# Patient Record
Sex: Male | Born: 1980
Health system: Southern US, Community
[De-identification: ages and names within clinical notes are randomized; demographics above are authoritative.]

## PROBLEM LIST (undated history)

## (undated) DIAGNOSIS — S0990XA Unspecified injury of head, initial encounter: Secondary | ICD-10-CM

## (undated) DIAGNOSIS — F528 Other sexual dysfunction not due to a substance or known physiological condition: Secondary | ICD-10-CM

## (undated) DIAGNOSIS — E785 Hyperlipidemia, unspecified: Secondary | ICD-10-CM

## (undated) DIAGNOSIS — J302 Other seasonal allergic rhinitis: Secondary | ICD-10-CM

## (undated) DIAGNOSIS — C801 Malignant (primary) neoplasm, unspecified: Secondary | ICD-10-CM

## (undated) DIAGNOSIS — L709 Acne, unspecified: Secondary | ICD-10-CM

## (undated) DIAGNOSIS — Z87442 Personal history of urinary calculi: Secondary | ICD-10-CM

## (undated) DIAGNOSIS — J342 Deviated nasal septum: Secondary | ICD-10-CM

## (undated) HISTORY — DX: Hyperlipidemia, unspecified: E78.5

## (undated) HISTORY — DX: Other sexual dysfunction not due to a substance or known physiological condition: F52.8

## (undated) HISTORY — DX: Malignant (primary) neoplasm, unspecified: C80.1

## (undated) HISTORY — PX: TRACHEOSTOMY CLOSURE: SHX458

## (undated) HISTORY — DX: Deviated nasal septum: J34.2

---
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
## 2000-05-05 ENCOUNTER — Ambulatory Visit (HOSPITAL_COMMUNITY): Admission: RE | Admit: 2000-05-05 | Discharge: 2000-05-05 | Payer: Self-pay | Admitting: Internal Medicine

## 2000-05-05 ENCOUNTER — Encounter: Payer: Self-pay | Admitting: Internal Medicine

## 2001-12-17 ENCOUNTER — Emergency Department (HOSPITAL_COMMUNITY): Admission: EM | Admit: 2001-12-17 | Discharge: 2001-12-17 | Payer: Self-pay | Admitting: Emergency Medicine

## 2005-05-06 ENCOUNTER — Ambulatory Visit: Payer: Self-pay | Admitting: Internal Medicine

## 2007-03-08 ENCOUNTER — Ambulatory Visit: Payer: Self-pay | Admitting: Internal Medicine

## 2008-04-16 ENCOUNTER — Telehealth (INDEPENDENT_AMBULATORY_CARE_PROVIDER_SITE_OTHER): Payer: Self-pay | Admitting: *Deleted

## 2008-06-03 ENCOUNTER — Ambulatory Visit: Payer: Self-pay | Admitting: Internal Medicine

## 2008-06-03 LAB — CONVERTED CEMR LAB
ALT: 19 U/L
AST: 25 U/L
Albumin: 4.3 g/dL
Alkaline Phosphatase: 77 U/L
BUN: 13 mg/dL
Basophils Absolute: 0 10*3/uL
Basophils Relative: 0.3 %
Bilirubin Urine: NEGATIVE
Bilirubin, Direct: 0.3 mg/dL
CO2: 30 meq/L
Calcium: 9.2 mg/dL
Chloride: 109 meq/L
Cholesterol: 168 mg/dL
Creatinine, Ser: 1.2 mg/dL
Eosinophils Absolute: 0.2 10*3/uL
Eosinophils Relative: 2.8 %
GFR calc Af Amer: 92 mL/min
GFR calc non Af Amer: 76 mL/min
Glucose, Bld: 94 mg/dL
HCT: 49 %
HDL: 37.1 mg/dL — ABNORMAL LOW
Hemoglobin: 17.5 g/dL — ABNORMAL HIGH
Ketones, ur: NEGATIVE mg/dL
LDL Cholesterol: 105 mg/dL — ABNORMAL HIGH
Leukocytes, UA: NEGATIVE
Lymphocytes Relative: 33.7 %
MCHC: 35.7 g/dL
MCV: 96.7 fL
Monocytes Absolute: 0.6 10*3/uL
Monocytes Relative: 7.3 %
Neutro Abs: 4.3 10*3/uL
Neutrophils Relative %: 55.9 %
Nitrite: NEGATIVE
Platelets: 208 10*3/uL
Potassium: 4.3 meq/L
RBC: 5.07 M/uL
RDW: 11.1 % — ABNORMAL LOW
Sodium: 143 meq/L
Specific Gravity, Urine: 1.02
TSH: 0.97 u[IU]/mL
Total Bilirubin: 1.4 mg/dL — ABNORMAL HIGH
Total CHOL/HDL Ratio: 4.5
Total Protein, Urine: NEGATIVE mg/dL
Total Protein: 6.9 g/dL
Triglycerides: 130 mg/dL
Urine Glucose: NEGATIVE mg/dL
Urobilinogen, UA: 0.2
VLDL: 26 mg/dL
WBC: 7.7 10*3/uL
pH: 6.5

## 2008-06-04 LAB — CONVERTED CEMR LAB

## 2008-06-06 ENCOUNTER — Ambulatory Visit: Payer: Self-pay | Admitting: Internal Medicine

## 2008-06-06 DIAGNOSIS — F528 Other sexual dysfunction not due to a substance or known physiological condition: Secondary | ICD-10-CM

## 2008-06-06 DIAGNOSIS — J342 Deviated nasal septum: Secondary | ICD-10-CM | POA: Insufficient documentation

## 2008-06-06 DIAGNOSIS — J309 Allergic rhinitis, unspecified: Secondary | ICD-10-CM | POA: Insufficient documentation

## 2008-06-06 HISTORY — DX: Other sexual dysfunction not due to a substance or known physiological condition: F52.8

## 2011-02-23 ENCOUNTER — Other Ambulatory Visit: Payer: Self-pay | Admitting: Internal Medicine

## 2011-02-23 ENCOUNTER — Other Ambulatory Visit: Payer: Self-pay

## 2011-02-23 DIAGNOSIS — Z0000 Encounter for general adult medical examination without abnormal findings: Secondary | ICD-10-CM

## 2011-02-24 ENCOUNTER — Other Ambulatory Visit (INDEPENDENT_AMBULATORY_CARE_PROVIDER_SITE_OTHER): Payer: Commercial Managed Care - PPO

## 2011-02-24 DIAGNOSIS — Z0000 Encounter for general adult medical examination without abnormal findings: Secondary | ICD-10-CM

## 2011-02-24 LAB — CBC WITH DIFFERENTIAL/PLATELET
Basophils Absolute: 0 10*3/uL (ref 0.0–0.1)
Basophils Relative: 0.3 % (ref 0.0–3.0)
Eosinophils Absolute: 0.1 10*3/uL (ref 0.0–0.7)
Eosinophils Relative: 2 % (ref 0.0–5.0)
HCT: 47.2 % (ref 39.0–52.0)
Hemoglobin: 16.7 g/dL (ref 13.0–17.0)
Lymphocytes Relative: 30.9 % (ref 12.0–46.0)
Lymphs Abs: 2.3 10*3/uL (ref 0.7–4.0)
MCHC: 35.3 g/dL (ref 30.0–36.0)
MCV: 96.8 fl (ref 78.0–100.0)
Monocytes Absolute: 0.7 10*3/uL (ref 0.1–1.0)
Monocytes Relative: 8.9 % (ref 3.0–12.0)
Neutro Abs: 4.3 10*3/uL (ref 1.4–7.7)
Neutrophils Relative %: 57.9 % (ref 43.0–77.0)
Platelets: 177 10*3/uL (ref 150.0–400.0)
RBC: 4.88 Mil/uL (ref 4.22–5.81)
RDW: 12.2 % (ref 11.5–14.6)
WBC: 7.4 10*3/uL (ref 4.5–10.5)

## 2011-02-24 LAB — HEPATIC FUNCTION PANEL
Albumin: 4.2 g/dL (ref 3.5–5.2)
Total Protein: 6.8 g/dL (ref 6.0–8.3)

## 2011-02-24 LAB — URIC ACID: Uric Acid, Serum: 6.5 mg/dL (ref 4.0–7.8)

## 2011-02-24 LAB — BASIC METABOLIC PANEL
BUN: 13 mg/dL (ref 6–23)
CO2: 30 mEq/L (ref 19–32)
Calcium: 9.5 mg/dL (ref 8.4–10.5)
Creatinine, Ser: 1.2 mg/dL (ref 0.4–1.5)
Glucose, Bld: 87 mg/dL (ref 70–99)

## 2011-02-24 LAB — TSH: TSH: 1.87 u[IU]/mL (ref 0.35–5.50)

## 2011-02-24 LAB — LIPID PANEL: Cholesterol: 146 mg/dL (ref 0–200)

## 2011-02-28 ENCOUNTER — Encounter: Payer: Self-pay | Admitting: Internal Medicine

## 2011-02-28 DIAGNOSIS — Z0001 Encounter for general adult medical examination with abnormal findings: Secondary | ICD-10-CM | POA: Insufficient documentation

## 2011-03-02 ENCOUNTER — Ambulatory Visit (INDEPENDENT_AMBULATORY_CARE_PROVIDER_SITE_OTHER): Payer: 59 | Admitting: Internal Medicine

## 2011-03-02 ENCOUNTER — Encounter: Payer: Self-pay | Admitting: Internal Medicine

## 2011-03-02 VITALS — BP 130/68 | HR 74 | Temp 97.9°F | Ht 72.0 in | Wt 183.0 lb

## 2011-03-02 DIAGNOSIS — Z0000 Encounter for general adult medical examination without abnormal findings: Secondary | ICD-10-CM

## 2011-03-02 MED ORDER — SILDENAFIL CITRATE 100 MG PO TABS: 100.0000 mg | ORAL_TABLET | Freq: Every day | ORAL | Status: DC | PRN

## 2011-03-02 MED ORDER — CETIRIZINE HCL 10 MG PO TABS: 10.0000 mg | ORAL_TABLET | Freq: Every day | ORAL | Status: DC

## 2011-03-02 MED ORDER — FLUTICASONE PROPIONATE 50 MCG/ACT NA SUSP: 2.0000 | Freq: Every day | NASAL | Status: DC

## 2011-03-02 NOTE — Progress Notes (Signed)
  Subjective:    Patient ID: Douglas Gibbs, male    DOB: 02-19-1981, 30 y.o.   MRN: [id number]  HPI Here for wellness and f/u;  Overall doing ok;  Pt denies CP, worsening SOB, DOE, wheezing, orthopnea, PND, worsening LE edema, palpitations, dizziness or syncope.  Pt denies neurological change such as new Headache, facial or extremity weakness.  Pt denies polydipsia, polyuria, or low sugar symptoms. Pt states overall good compliance with treatment and medications, good tolerability, and trying to follow lower cholesterol diet.  Pt denies worsening depressive symptoms, suicidal ideation or panic. No fever, wt loss, night sweats, loss of appetite, or other constitutional symptoms.  Pt states good ability with ADL's, low fall risk, home safety reviewed and adequate, no significant changes in hearing or vision, and occasionally active with exercise.   Past Medical History  Diagnosis Date  . ERECTILE DYSFUNCTION 06/06/2008  . Deviated nasal septum 06/06/2008  . ALLERGIC RHINITIS 06/06/2008   Past Surgical History  Procedure Date  . S/p tracheostomy 2000    reports that he has quit smoking. He does not have any smokeless tobacco history on file. He reports that he drinks alcohol. His drug history not on file. family history includes Cancer in his other. No Known Allergies No current outpatient prescriptions on file prior to visit.       Review of Systems Review of Systems  Constitutional: Negative for diaphoresis, activity change, appetite change and unexpected weight change.  HENT: Negative for hearing loss, ear pain, facial swelling, mouth sores and neck stiffness.   Eyes: Negative for pain, redness and visual disturbance.  Respiratory: Negative for shortness of breath and wheezing.   Cardiovascular: Negative for chest pain and palpitations.  Gastrointestinal: Negative for diarrhea, blood in stool, abdominal distention and rectal pain.  Genitourinary: Negative for hematuria, flank pain and  decreased urine volume.  Musculoskeletal: Negative for myalgias and joint swelling.  Skin: Negative for color change and wound.  Neurological: Negative for syncope and numbness.  Hematological: Negative for adenopathy.  Psychiatric/Behavioral: Negative for hallucinations, self-injury, decreased concentration and agitation.      Objective:   Physical Exam BP 130/68  Pulse 74  Temp(Src) 97.9 F (36.6 C) (Oral)  Ht 6' (1.829 m)  Wt 183 lb (83.008 kg)  BMI 24.82 kg/m2  SpO2 97% Physical Exam  VS noted Constitutional: Pt is oriented to person, place, and time. Appears well-developed and well-nourished.  HENT:  Head: Normocephalic and atraumatic.  Right Ear: External ear normal.  Left Ear: External ear normal.  Nose: Nose normal.  Mouth/Throat: Oropharynx is clear and moist.  Eyes: Conjunctivae and EOM are normal. Pupils are equal, round, and reactive to light.  Neck: Normal range of motion. Neck supple. No JVD present. No tracheal deviation present.  Cardiovascular: Normal rate, regular rhythm, normal heart sounds and intact distal pulses.   Pulmonary/Chest: Effort normal and breath sounds normal.  Abdominal: Soft. Bowel sounds are normal. There is no tenderness.  Musculoskeletal: Normal range of motion. Exhibits no edema.  Lymphadenopathy:  Has no cervical adenopathy.  Neurological: Pt is alert and oriented to person, place, and time. Pt has normal reflexes. No cranial nerve deficit.  Skin: Skin is warm and dry. No rash noted.  Psychiatric:  Has  normal mood and affect. Behavior is normal.         Assessment & Plan:

## 2011-03-02 NOTE — Patient Instructions (Signed)
Continue all other medications as before Please follow lower cholesterol diet, and get regular exercise Please return in 1 year for your yearly visit, or sooner if needed, with Lab testing done 3-5 days before

## 2011-03-07 ENCOUNTER — Encounter: Payer: Self-pay | Admitting: Internal Medicine

## 2011-03-07 NOTE — Assessment & Plan Note (Signed)

## 2012-01-24 DIAGNOSIS — J342 Deviated nasal septum: Secondary | ICD-10-CM

## 2012-01-24 HISTORY — DX: Deviated nasal septum: J34.2

## 2012-02-08 ENCOUNTER — Encounter (HOSPITAL_BASED_OUTPATIENT_CLINIC_OR_DEPARTMENT_OTHER): Payer: Self-pay | Admitting: *Deleted

## 2012-02-14 ENCOUNTER — Encounter (HOSPITAL_BASED_OUTPATIENT_CLINIC_OR_DEPARTMENT_OTHER): Payer: Self-pay | Admitting: Anesthesiology

## 2012-02-14 ENCOUNTER — Ambulatory Visit (HOSPITAL_BASED_OUTPATIENT_CLINIC_OR_DEPARTMENT_OTHER)
Admission: RE | Admit: 2012-02-14 | Discharge: 2012-02-14 | Disposition: A | Discharge status: Home / Self Care | Payer: 59 | Source: Ambulatory Visit | Attending: Otolaryngology | Admitting: Otolaryngology

## 2012-02-14 ENCOUNTER — Encounter (HOSPITAL_BASED_OUTPATIENT_CLINIC_OR_DEPARTMENT_OTHER): Payer: Self-pay

## 2012-02-14 ENCOUNTER — Ambulatory Visit (HOSPITAL_BASED_OUTPATIENT_CLINIC_OR_DEPARTMENT_OTHER): Payer: 59 | Admitting: Anesthesiology

## 2012-02-14 ENCOUNTER — Encounter (HOSPITAL_BASED_OUTPATIENT_CLINIC_OR_DEPARTMENT_OTHER)
Admission: RE | Disposition: A | Discharge status: Home / Self Care | Payer: Self-pay | Source: Ambulatory Visit | Attending: Otolaryngology

## 2012-02-14 DIAGNOSIS — J343 Hypertrophy of nasal turbinates: Secondary | ICD-10-CM | POA: Insufficient documentation

## 2012-02-14 DIAGNOSIS — J342 Deviated nasal septum: Secondary | ICD-10-CM | POA: Insufficient documentation

## 2012-02-14 DIAGNOSIS — Z87828 Personal history of other (healed) physical injury and trauma: Secondary | ICD-10-CM | POA: Insufficient documentation

## 2012-02-14 HISTORY — PX: NASAL SEPTOPLASTY W/ TURBINOPLASTY: SHX2070

## 2012-02-14 HISTORY — DX: Other seasonal allergic rhinitis: J30.2

## 2012-02-14 HISTORY — DX: Acne, unspecified: L70.9

## 2012-02-14 HISTORY — DX: Unspecified injury of head, initial encounter: S09.90XA

## 2012-02-14 LAB — POCT HEMOGLOBIN-HEMACUE: Hemoglobin: 16.3 g/dL (ref 13.0–17.0)

## 2012-02-14 SURGERY — SEPTOPLASTY, NOSE, WITH NASAL TURBINATE REDUCTION
Anesthesia: General | Site: Nose | Laterality: Bilateral | Wound class: Clean Contaminated

## 2012-02-14 MED ORDER — ACETAMINOPHEN 10 MG/ML IV SOLN
1000.0000 mg | Freq: Once | INTRAVENOUS | Status: AC
  Administered 2012-02-14: 1000 mg via INTRAVENOUS

## 2012-02-14 MED ORDER — HYDROCODONE-ACETAMINOPHEN 5-325 MG PO TABS: 1.0000 | ORAL_TABLET | Freq: Four times a day (QID) | ORAL | Status: AC | PRN

## 2012-02-14 MED ORDER — AMOXICILLIN-POT CLAVULANATE 500-125 MG PO TABS: 1.0000 | ORAL_TABLET | Freq: Two times a day (BID) | ORAL | Status: AC

## 2012-02-14 MED ORDER — LIDOCAINE HCL (CARDIAC) 20 MG/ML IV SOLN
INTRAVENOUS | Status: DC | PRN
  Administered 2012-02-14: 50 mg via INTRAVENOUS

## 2012-02-14 MED ORDER — HYDROMORPHONE HCL PF 1 MG/ML IJ SOLN: 0.2500 mg | INTRAMUSCULAR | Status: DC | PRN

## 2012-02-14 MED ORDER — OXYMETAZOLINE HCL 0.05 % NA SOLN
NASAL | Status: DC | PRN
  Administered 2012-02-14: 1 via NASAL

## 2012-02-14 MED ORDER — FENTANYL CITRATE 0.05 MG/ML IJ SOLN
INTRAMUSCULAR | Status: DC | PRN
  Administered 2012-02-14: 100 ug via INTRAVENOUS
  Administered 2012-02-14 (×2): 25 ug via INTRAVENOUS

## 2012-02-14 MED ORDER — LACTATED RINGERS IV SOLN
INTRAVENOUS | Status: DC
  Administered 2012-02-14 (×2): via INTRAVENOUS

## 2012-02-14 MED ORDER — DROPERIDOL 2.5 MG/ML IJ SOLN
INTRAMUSCULAR | Status: DC | PRN
  Administered 2012-02-14: 0.625 mg via INTRAVENOUS

## 2012-02-14 MED ORDER — MUPIROCIN 2 % EX OINT
TOPICAL_OINTMENT | CUTANEOUS | Status: DC | PRN
  Administered 2012-02-14: 1 via NASAL

## 2012-02-14 MED ORDER — ONDANSETRON HCL 4 MG/2ML IJ SOLN
INTRAMUSCULAR | Status: DC | PRN
  Administered 2012-02-14: 4 mg via INTRAVENOUS

## 2012-02-14 MED ORDER — SUCCINYLCHOLINE CHLORIDE 20 MG/ML IJ SOLN
INTRAMUSCULAR | Status: DC | PRN
  Administered 2012-02-14: 100 mg via INTRAVENOUS

## 2012-02-14 MED ORDER — PROPOFOL 10 MG/ML IV EMUL
INTRAVENOUS | Status: DC | PRN
  Administered 2012-02-14: 250 mg via INTRAVENOUS

## 2012-02-14 MED ORDER — LIDOCAINE-EPINEPHRINE 1 %-1:100000 IJ SOLN
INTRAMUSCULAR | Status: DC | PRN
  Administered 2012-02-14: 10 mL

## 2012-02-14 MED ORDER — DEXAMETHASONE SODIUM PHOSPHATE 4 MG/ML IJ SOLN
INTRAMUSCULAR | Status: DC | PRN
  Administered 2012-02-14: 10 mg via INTRAVENOUS

## 2012-02-14 MED ORDER — METOCLOPRAMIDE HCL 5 MG/ML IJ SOLN: 10.0000 mg | Freq: Once | INTRAMUSCULAR | Status: DC | PRN

## 2012-02-14 MED ORDER — MIDAZOLAM HCL 5 MG/5ML IJ SOLN
INTRAMUSCULAR | Status: DC | PRN
  Administered 2012-02-14: 2 mg via INTRAVENOUS

## 2012-02-14 SURGICAL SUPPLY — 30 items
ATTRACTOMAT 16X20 MAGNETIC DRP (DRAPES) IMPLANT
BLADE SURG 15 STRL LF DISP TIS (BLADE) ×1 IMPLANT
BLADE SURG 15 STRL SS (BLADE)
CANISTER SUCTION 1200CC (MISCELLANEOUS) ×2 IMPLANT
CLOTH BEACON ORANGE TIMEOUT ST (SAFETY) ×2 IMPLANT
COAGULATOR SUCT SWTCH 10FR 6 (ELECTROSURGICAL) IMPLANT
DECANTER SPIKE VIAL GLASS SM (MISCELLANEOUS) IMPLANT
DRSG NASOPORE 8CM (GAUZE/BANDAGES/DRESSINGS) IMPLANT
ELECT REM PT RETURN 9FT ADLT (ELECTROSURGICAL)
ELECTRODE REM PT RTRN 9FT ADLT (ELECTROSURGICAL) IMPLANT
GLOVE BIO SURGEON STRL SZ 6.5 (GLOVE) ×2 IMPLANT
GLOVE BIOGEL M 7.0 STRL (GLOVE) ×4 IMPLANT
GOWN PREVENTION PLUS XLARGE (GOWN DISPOSABLE) ×3 IMPLANT
NEEDLE 27GAX1X1/2 (NEEDLE) ×2 IMPLANT
NS IRRIG 1000ML POUR BTL (IV SOLUTION) ×1 IMPLANT
PACK BASIN DAY SURGERY FS (CUSTOM PROCEDURE TRAY) ×2 IMPLANT
PACK ENT DAY SURGERY (CUSTOM PROCEDURE TRAY) ×2 IMPLANT
SET EXT MALE ROTATING LL 32IN (MISCELLANEOUS) ×2 IMPLANT
SET IV EXT TUBING FEMALE 31 (MISCELLANEOUS) ×1 IMPLANT
SPLINT NASAL DOYLE BI-VL (GAUZE/BANDAGES/DRESSINGS) ×2 IMPLANT
SPONGE GAUZE 2X2 8PLY STRL LF (GAUZE/BANDAGES/DRESSINGS) ×2 IMPLANT
SPONGE NEURO XRAY DETECT 1X3 (DISPOSABLE) ×2 IMPLANT
SPONGE SURGIFOAM ABS GEL 12-7 (HEMOSTASIS) IMPLANT
SUT ETHILON 3 0 PS 1 (SUTURE) ×2 IMPLANT
SUT PLAIN 4 0 ~~LOC~~ 1 (SUTURE) ×2 IMPLANT
TOWEL OR 17X24 6PK STRL BLUE (TOWEL DISPOSABLE) ×2 IMPLANT
TUBE SALEM SUMP 12R W/ARV (TUBING) IMPLANT
TUBE SALEM SUMP 16 FR W/ARV (TUBING) ×1 IMPLANT
WATER STERILE IRR 1000ML POUR (IV SOLUTION) ×1 IMPLANT
YANKAUER SUCT BULB TIP NO VENT (SUCTIONS) ×2 IMPLANT

## 2012-02-14 NOTE — Anesthesia Preprocedure Evaluation (Signed)
Anesthesia Evaluation  Patient identified by MRN, date of birth, ID band Patient awake    Reviewed: Allergy & Precautions, H&P , NPO status , Patient's Chart, lab work & pertinent test results, reviewed documented beta blocker date and time   Airway Mallampati: II TM Distance: >3 FB Neck ROM: full    Dental   Pulmonary neg pulmonary ROS,          Cardiovascular negative cardio ROS      Neuro/Psych negative psych ROS   GI/Hepatic negative GI ROS, Neg liver ROS,   Endo/Other  negative endocrine ROS  Renal/GU negative Renal ROS  negative genitourinary   Musculoskeletal   Abdominal   Peds  Hematology negative hematology ROS (+)   Anesthesia Other Findings See surgeon's H&P   Reproductive/Obstetrics negative OB ROS                           Anesthesia Physical Anesthesia Plan  ASA: II  Anesthesia Plan: General   Post-op Pain Management:    Induction: Intravenous  Airway Management Planned: Oral ETT  Additional Equipment:   Intra-op Plan:   Post-operative Plan: Extubation in OR  Informed Consent: I have reviewed the patients History and Physical, chart, labs and discussed the procedure including the risks, benefits and alternatives for the proposed anesthesia with the patient or authorized representative who has indicated his/her understanding and acceptance.     Plan Discussed with: CRNA and Surgeon  Anesthesia Plan Comments:         Anesthesia Quick Evaluation

## 2012-02-14 NOTE — Transfer of Care (Deleted)
Immediate Anesthesia Transfer of Care Note  Patient: Douglas Gibbs  Procedure(s) Performed: Procedure(s) (LRB): NASAL SEPTOPLASTY WITH TURBINATE REDUCTION (Bilateral)  Patient Location: PACU  Anesthesia Type: General  Level of Consciousness: awake, alert  and oriented  Airway & Oxygen Therapy: Patient Spontanous Breathing and Patient connected to face mask oxygen  Post-op Assessment: Report given to PACU RN and Post -op Vital signs reviewed and stable  Post vital signs: Reviewed and stable  Complications: No apparent anesthesia complications

## 2012-02-14 NOTE — Transfer of Care (Signed)
Immediate Anesthesia Transfer of Care Note  Patient: Douglas Gibbs  Procedure(s) Performed: Procedure(s) (LRB): NASAL SEPTOPLASTY WITH TURBINATE REDUCTION (Bilateral)  Patient Location: PACU  Anesthesia Type: General  Level of Consciousness: awake, alert  and oriented  Airway & Oxygen Therapy: Patient Spontanous Breathing and Patient connected to face mask oxygen  Post-op Assessment: Report given to PACU RN  Post vital signs: Reviewed and stable  Complications: No apparent anesthesia complications
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
## 2012-02-14 NOTE — Brief Op Note (Signed)
02/14/2012  10:07 AM  PATIENT:  Douglas Gibbs  31 y.o. male  PRE-OPERATIVE DIAGNOSIS:  deviated septum  POST-OPERATIVE DIAGNOSIS:  deviated nasal septum  PROCEDURE:  Procedure(s) (LRB): NASAL SEPTOPLASTY WITH TURBINATE REDUCTION (Bilateral)  SURGEON:  Surgeon(s) and Role:    * Osborn Coho, MD - Primary  PHYSICIAN ASSISTANT:   ASSISTANTS: none   ANESTHESIA:   general  EBL:  Total I/O In: 1350 [I.V.:1350] Out: - 50cc  BLOOD ADMINISTERED:none  DRAINS: none   LOCAL MEDICATIONS USED:  LIDOCAINE  and Amount: 10 ml  SPECIMEN:  No Specimen  DISPOSITION OF SPECIMEN:  N/A  COUNTS:  YES  TOURNIQUET:  * No tourniquets in log *  DICTATION: .Other Dictation: Dictation Number [id number]  PLAN OF CARE: Discharge to home after PACU  PATIENT DISPOSITION:  PACU - hemodynamically stable.   Delay start of Pharmacological VTE agent (>24hrs) due to surgical blood loss or risk of bleeding: not applicable

## 2012-02-14 NOTE — Anesthesia Postprocedure Evaluation (Signed)
Anesthesia Post Note  Patient: Douglas Gibbs  Procedure(s) Performed: Procedure(s) (LRB): NASAL SEPTOPLASTY WITH TURBINATE REDUCTION (Bilateral)  Anesthesia type: General  Patient location: PACU  Post pain: Pain level controlled  Post assessment: Patient's Cardiovascular Status Stable  Last Vitals:  Filed Vitals:   02/14/12 1103  BP: 124/74  Pulse: 59  Temp: 36.7 C  Resp: 16    Post vital signs: Reviewed and stable  Level of consciousness: alert  Complications: No apparent anesthesia complications

## 2012-02-14 NOTE — Discharge Instructions (Addendum)
Nasal Septal Reconstruction Nasal septal reconstruction or nasal septoplasty is a procedure to straighten the nasal septum. The nasal septum is a wall that separates the two nostrils and nasal passages. It is slightly off center in most people. If the septum is severely deviated, it may result in problems, such as difficulty in breathing through the nose. The bend in the septum may be present at birth or could be due to an injury. This procedure is done if you have any of the following problems.  Deviation of the nasal septum.   Repeated infection of the sinuses (air-filled cavities in the skull).   Pain due to the deviated septum.   Loss of smell due to the deviated septum.   A blood clot in the septum that interferes your breathing.   Frequent nosebleeds.  If the outside of the nose is bent, it may have to be reconstructed by a surgery called rhinoplasty. Sometimes, this procedure may be combined with septoplasty. LET YOUR CAREGIVER KNOW ABOUT:   Allergies.   Previous problems with anesthetics.   History of bleeding or blood problems.   Any medicines that you are currently taking.  RISKS AND COMPLICATIONS  You may have a hole in the septum.   You may have a collection of blood in the septum.   You may develop loss of sensation in the upper lip or teeth.   You may develop an infection.   You may have bleeding.   The front portion of your nose may become flatter than what it was before the procedure.   You may develop a reaction to the anesthetic used.   You may have a recurrence of the nasal obstruction.  BEFORE THE PROCEDURE   Follow the instructions given by your caregiver.   Your caregiver may recommend x-ray and blood tests.   Your caregiver may advise you to stop smoking for at least 2 weeks before the procedure.   Your caregiver may advise you to stop taking aspirin and anti-inflammatory medications such as ibuprofen, 10 days before surgery, as these medicines  can cause bleeding.  If the surgery is going to be under general anesthesia:  You may be advised to eat only a light meal, such as soup or salad the previous night.   You will be advised to avoid eating or drinking anything after midnight and also in the morning of the procedure.  PROCEDURE  If the procedure is being done under general anesthesia, you may be put to sleep. You will not feel the pain. You will not be aware of the procedure. It can also be done under local anesthesia with sedation where the area of the surgery is numbed. The surgeon then makes a cut on the inner lining of the septum. If there is a blood clot, it is drained. The bone and cartilage of the septum are reshaped. The straightened septum is held in place using plastic sheets or splints. Your nose is then packed with gauze to control the bleeding. The procedure may take one to one and a half hours. It generally does not cause bruising or black eyes. AFTER THE PROCEDURE   You may be kept in the recovery room till the effect of the anesthesia wears off.   You may be then brought to your room in the hospital.   You may be asked to breathe through your mouth.   Your nose packing may need to stay in place for 3 to 4 days.   You may   be given medicines for discomfort and nausea.   You may be given antibiotics.   You may be allowed to go home on the same day or have to stay in the hospital for a few days.  HOME CARE INSTRUCTIONS   Do not blow your nose.   Avoid doing heavy work and strenuous exercise for at least one week after the procedure.   Avoid pushing or moving your nose before it heals.   Avoid lifting weight and bending forwards.   Avoid using products that contain aspirin.   Keep your head raised while lying down.   Take the medicines as instructed by your caregiver.   Inform your caregiver if you have any problems after taking your medicine.  SEEK MEDICAL CARE IF:   You have a new symptom.   You  have doubts regarding the procedure or its outcome.  SEEK IMMEDIATE MEDICAL CARE IF:   You develop fever over 102 F (38.9 C).   You have severe difficulty in breathing.   Your nose continues to bleed even after you keep your head raised and apply ice to your forehead and nose for 10 to 15 minutes.  Document Released: 01/18/2008 Document Revised: 09/30/2011 Document Reviewed: 01/18/2008 [employer] Patient Information 2012 [employer].   Post Anesthesia Home Care Instructions  Activity: Get plenty of rest for the remainder of the day. A responsible adult should stay with you for 24 hours following the procedure.  For the next 24 hours, DO NOT: -Drive a car -Operate machinery -Drink alcoholic beverages -Take any medication unless instructed by your physician -Make any legal decisions or sign important papers.  Meals: Start with liquid foods such as gelatin or soup. Progress to regular foods as tolerated. Avoid greasy, spicy, heavy foods. If nausea and/or vomiting occur, drink only clear liquids until the nausea and/or vomiting subsides. Call your physician if vomiting continues.  Special Instructions/Symptoms: Your throat may feel dry or sore from the anesthesia or the breathing tube placed in your throat during surgery. If this causes discomfort, gargle with warm salt water. The discomfort should disappear within 24 hours.   

## 2012-02-14 NOTE — Anesthesia Procedure Notes (Signed)
Procedure Name: Intubation Date/Time: 02/14/2012 8:57 AM Performed by: Zenia Resides D Pre-anesthesia Checklist: Patient identified, Emergency Drugs available, Suction available, Patient being monitored and Timeout performed Patient Re-evaluated:Patient Re-evaluated prior to inductionOxygen Delivery Method: Circle System Utilized Preoxygenation: Pre-oxygenation with 100% oxygen Intubation Type: IV induction Ventilation: Mask ventilation without difficulty Grade View: Grade I Tube type: Oral Tube size: 8.0 mm Number of attempts: 1 Airway Equipment and Method: stylet and oral airway Placement Confirmation: ETT inserted through vocal cords under direct vision,  positive ETCO2 and breath sounds checked- equal and bilateral Secured at: 23 cm Tube secured with: Tape Dental Injury: Teeth and Oropharynx as per pre-operative assessment
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
## 2012-02-15 ENCOUNTER — Encounter (HOSPITAL_BASED_OUTPATIENT_CLINIC_OR_DEPARTMENT_OTHER): Payer: Self-pay | Admitting: Otolaryngology

## 2012-02-29 ENCOUNTER — Other Ambulatory Visit (INDEPENDENT_AMBULATORY_CARE_PROVIDER_SITE_OTHER): Payer: 59

## 2012-02-29 ENCOUNTER — Telehealth: Payer: Self-pay

## 2012-02-29 DIAGNOSIS — Z0000 Encounter for general adult medical examination without abnormal findings: Secondary | ICD-10-CM

## 2012-02-29 LAB — LIPID PANEL
Cholesterol: 174 mg/dL (ref 0–200)
HDL: 48.5 mg/dL (ref >39.00)
Triglycerides: 95 mg/dL (ref 0.0–149.0)

## 2012-02-29 LAB — BASIC METABOLIC PANEL
CO2: 27 mEq/L (ref 19–32)
Calcium: 9.8 mg/dL (ref 8.4–10.5)
Glucose, Bld: 95 mg/dL (ref 70–99)
Sodium: 142 mEq/L (ref 135–145)

## 2012-02-29 LAB — URINALYSIS, ROUTINE W REFLEX MICROSCOPIC
Bilirubin Urine: NEGATIVE
Hgb urine dipstick: NEGATIVE
Ketones, ur: NEGATIVE
Urine Glucose: NEGATIVE
Urobilinogen, UA: 0.2 (ref 0.0–1.0)

## 2012-02-29 LAB — CBC WITH DIFFERENTIAL/PLATELET
Eosinophils Relative: 2 % (ref 0.0–5.0)
HCT: 48.8 % (ref 39.0–52.0)
Hemoglobin: 16.9 g/dL (ref 13.0–17.0)
Lymphs Abs: 2.5 10*3/uL (ref 0.7–4.0)
Monocytes Relative: 5.7 % (ref 3.0–12.0)
Neutro Abs: 5.1 10*3/uL (ref 1.4–7.7)
WBC: 8.3 10*3/uL (ref 4.5–10.5)

## 2012-02-29 LAB — HEPATIC FUNCTION PANEL
ALT: 24 U/L (ref 0–53)
Albumin: 4.4 g/dL (ref 3.5–5.2)
Alkaline Phosphatase: 94 U/L (ref 39–117)
Total Protein: 7.8 g/dL (ref 6.0–8.3)

## 2012-02-29 NOTE — Telephone Encounter (Signed)
Put order in for physical labs. 

## 2012-03-01 ENCOUNTER — Ambulatory Visit: Payer: 59 | Admitting: Internal Medicine

## 2012-03-01 ENCOUNTER — Encounter: Payer: Self-pay | Admitting: Internal Medicine

## 2012-03-01 ENCOUNTER — Ambulatory Visit (INDEPENDENT_AMBULATORY_CARE_PROVIDER_SITE_OTHER): Payer: 59 | Admitting: Internal Medicine

## 2012-03-01 VITALS — BP 112/80 | HR 83 | Temp 97.8°F | Ht 72.0 in | Wt 185.4 lb

## 2012-03-01 DIAGNOSIS — J342 Deviated nasal septum: Secondary | ICD-10-CM

## 2012-03-01 DIAGNOSIS — Z0000 Encounter for general adult medical examination without abnormal findings: Secondary | ICD-10-CM

## 2012-03-01 NOTE — Patient Instructions (Signed)
Continue all other medications as before Please have the pharmacy call with any refills you may need. Please continue your efforts at being more active, low cholesterol diet, and weight control. Please return in 1 year for your yearly visit, or sooner if needed, with Lab testing done 3-5 days before

## 2012-03-05 ENCOUNTER — Encounter: Payer: Self-pay | Admitting: Internal Medicine
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
## 2012-03-05 NOTE — Assessment & Plan Note (Signed)

## 2013-03-02 ENCOUNTER — Telehealth: Payer: Self-pay

## 2013-03-02 NOTE — Telephone Encounter (Signed)
Pt call requesting the hours of the lab M-F. I called back and left a message letting him know the hours are 7:30 - 5:30.

## 2013-03-29 ENCOUNTER — Other Ambulatory Visit (INDEPENDENT_AMBULATORY_CARE_PROVIDER_SITE_OTHER): Payer: 59

## 2013-03-29 DIAGNOSIS — Z0000 Encounter for general adult medical examination without abnormal findings: Secondary | ICD-10-CM

## 2013-03-29 LAB — CBC WITH DIFFERENTIAL/PLATELET
Basophils Relative: 0.8 % (ref 0.0–3.0)
Eosinophils Relative: 2 % (ref 0.0–5.0)
Lymphocytes Relative: 38.2 % (ref 12.0–46.0)
MCV: 98.1 fl (ref 78.0–100.0)
Monocytes Relative: 7.7 % (ref 3.0–12.0)
Neutrophils Relative %: 51.3 % (ref 43.0–77.0)
Platelets: 188 10*3/uL (ref 150.0–400.0)
RBC: 4.68 Mil/uL (ref 4.22–5.81)
WBC: 8.7 10*3/uL (ref 4.5–10.5)

## 2013-03-29 LAB — TSH: TSH: 3.99 u[IU]/mL (ref 0.35–5.50)

## 2013-03-29 LAB — BASIC METABOLIC PANEL
BUN: 14 mg/dL (ref 6–23)
Creatinine, Ser: 1.1 mg/dL (ref 0.4–1.5)
GFR: 81.4 mL/min (ref >60.00)
Glucose, Bld: 90 mg/dL (ref 70–99)
Potassium: 3.6 mEq/L (ref 3.5–5.1)

## 2013-03-29 LAB — HEPATIC FUNCTION PANEL
ALT: 20 U/L (ref 0–53)
AST: 18 U/L (ref 0–37)
Alkaline Phosphatase: 68 U/L (ref 39–117)
Bilirubin, Direct: 0.1 mg/dL (ref 0.0–0.3)
Total Bilirubin: 0.9 mg/dL (ref 0.3–1.2)
Total Protein: 7.2 g/dL (ref 6.0–8.3)

## 2013-03-29 LAB — URINALYSIS, ROUTINE W REFLEX MICROSCOPIC
Nitrite: NEGATIVE
Total Protein, Urine: NEGATIVE
Urine Glucose: NEGATIVE
WBC, UA: NONE SEEN (ref 0–?)
pH: 6 (ref 5.0–8.0)

## 2013-03-29 LAB — LIPID PANEL
Cholesterol: 163 mg/dL (ref 0–200)
VLDL: 20.2 mg/dL (ref 0.0–40.0)

## 2013-04-05 ENCOUNTER — Encounter: Payer: Self-pay | Admitting: Internal Medicine

## 2013-04-05 ENCOUNTER — Ambulatory Visit (INDEPENDENT_AMBULATORY_CARE_PROVIDER_SITE_OTHER): Payer: 59 | Admitting: Internal Medicine

## 2013-04-05 VITALS — BP 118/80 | HR 78 | Temp 98.2°F | Ht 72.0 in | Wt 186.5 lb

## 2013-04-05 DIAGNOSIS — Z0000 Encounter for general adult medical examination without abnormal findings: Secondary | ICD-10-CM

## 2013-04-05 NOTE — Patient Instructions (Signed)
Please continue all other medications as before Please continue your efforts at being more active, low cholesterol diet, and weight control. You are otherwise up to date with prevention measures today.  Please remember to sign up for My Chart if you have not done so, as this will be important to you in the future with finding out test results, communicating by private email, and scheduling acute appointments online when needed.  Please keep your appointments with your specialists as you have planned - dermatology  Please return in 1 year for your yearly visit, or sooner if needed, with Lab testing done 3-5 days before
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
## 2013-04-05 NOTE — Assessment & Plan Note (Signed)

## 2013-07-05 ENCOUNTER — Telehealth: Payer: Self-pay

## 2013-07-05 NOTE — Telephone Encounter (Signed)
Patient sent email requesting refills.  Called the patient as current list had only fish oil and MV.  The  Patient would like to have refilled from historical list Flonase, zyrtec and Viagra sent to [facility].  Please adivse.  #90 day

## 2013-07-05 NOTE — Telephone Encounter (Signed)
Ok with me 

## 2013-07-06 MED ORDER — FLUTICASONE PROPIONATE 50 MCG/ACT NA SUSP: 2.0000 | Freq: Every day | NASAL | Status: DC

## 2013-07-06 MED ORDER — SILDENAFIL CITRATE 100 MG PO TABS: 100.0000 mg | ORAL_TABLET | Freq: Every day | ORAL | Status: DC | PRN

## 2013-07-06 MED ORDER — CETIRIZINE HCL 10 MG PO TABS: 10.0000 mg | ORAL_TABLET | Freq: Every day | ORAL | Status: DC

## 2013-07-06 NOTE — Telephone Encounter (Signed)
Refills sent in

## 2013-12-04 ENCOUNTER — Other Ambulatory Visit: Payer: Self-pay | Admitting: Internal Medicine

## 2014-03-21 ENCOUNTER — Other Ambulatory Visit: Payer: Self-pay | Admitting: Internal Medicine

## 2014-04-08 ENCOUNTER — Encounter: Payer: 59 | Admitting: Internal Medicine

## 2014-04-08 ENCOUNTER — Other Ambulatory Visit (INDEPENDENT_AMBULATORY_CARE_PROVIDER_SITE_OTHER): Payer: 59

## 2014-04-08 DIAGNOSIS — Z0000 Encounter for general adult medical examination without abnormal findings: Secondary | ICD-10-CM

## 2014-04-08 LAB — URINALYSIS, ROUTINE W REFLEX MICROSCOPIC
KETONES UR: NEGATIVE
LEUKOCYTES UA: NEGATIVE
Nitrite: NEGATIVE
SPECIFIC GRAVITY, URINE: 1.025 (ref 1.000–1.030)
Total Protein, Urine: 30 — AB
Urine Glucose: NEGATIVE
Urobilinogen, UA: 0.2 (ref 0.0–1.0)
pH: 6 (ref 5.0–8.0)

## 2014-04-08 LAB — CBC WITH DIFFERENTIAL/PLATELET
BASOS PCT: 0.3 % (ref 0.0–3.0)
Basophils Absolute: 0 10*3/uL (ref 0.0–0.1)
EOS PCT: 1.2 % (ref 0.0–5.0)
Eosinophils Absolute: 0.1 10*3/uL (ref 0.0–0.7)
HCT: 50 % (ref 39.0–52.0)
HEMOGLOBIN: 17.4 g/dL — AB (ref 13.0–17.0)
LYMPHS PCT: 26.7 % (ref 12.0–46.0)
Lymphs Abs: 2.8 10*3/uL (ref 0.7–4.0)
MCHC: 34.8 g/dL (ref 30.0–36.0)
MCV: 96.6 fl (ref 78.0–100.0)
MONOS PCT: 7.2 % (ref 3.0–12.0)
Monocytes Absolute: 0.8 10*3/uL (ref 0.1–1.0)
NEUTROS ABS: 6.9 10*3/uL (ref 1.4–7.7)
Neutrophils Relative %: 64.6 % (ref 43.0–77.0)
Platelets: 194 10*3/uL (ref 150.0–400.0)
RBC: 5.18 Mil/uL (ref 4.22–5.81)
RDW: 12.2 % (ref 11.5–15.5)
WBC: 10.7 10*3/uL — ABNORMAL HIGH (ref 4.0–10.5)

## 2014-04-08 LAB — HEPATIC FUNCTION PANEL
ALBUMIN: 4.8 g/dL (ref 3.5–5.2)
ALK PHOS: 79 U/L (ref 39–117)
ALT: 21 U/L (ref 0–53)
AST: 21 U/L (ref 0–37)
Bilirubin, Direct: 0.2 mg/dL (ref 0.0–0.3)
TOTAL PROTEIN: 7.7 g/dL (ref 6.0–8.3)
Total Bilirubin: 1.4 mg/dL — ABNORMAL HIGH (ref 0.2–1.2)

## 2014-04-08 LAB — TSH: TSH: 1.17 u[IU]/mL (ref 0.35–4.50)

## 2014-04-08 LAB — BASIC METABOLIC PANEL
BUN: 15 mg/dL (ref 6–23)
CALCIUM: 9.9 mg/dL (ref 8.4–10.5)
CO2: 30 meq/L (ref 19–32)
Chloride: 102 mEq/L (ref 96–112)
Creatinine, Ser: 1.1 mg/dL (ref 0.4–1.5)
GFR: 80.92 mL/min (ref >60.00)
Glucose, Bld: 107 mg/dL — ABNORMAL HIGH (ref 70–99)
POTASSIUM: 4.1 meq/L (ref 3.5–5.1)
SODIUM: 139 meq/L (ref 135–145)

## 2014-04-08 LAB — LIPID PANEL
Cholesterol: 180 mg/dL (ref 0–200)
HDL: 49.7 mg/dL (ref >39.00)
LDL Cholesterol: 114 mg/dL — ABNORMAL HIGH (ref 0–99)
NONHDL: 130.3
Total CHOL/HDL Ratio: 4
Triglycerides: 80 mg/dL (ref 0.0–149.0)
VLDL: 16 mg/dL (ref 0.0–40.0)

## 2014-04-09 ENCOUNTER — Ambulatory Visit (INDEPENDENT_AMBULATORY_CARE_PROVIDER_SITE_OTHER): Payer: 59 | Admitting: Internal Medicine

## 2014-04-09 ENCOUNTER — Encounter: Payer: Self-pay | Admitting: Internal Medicine

## 2014-04-09 VITALS — BP 112/70 | HR 85 | Temp 98.3°F | Ht 72.0 in | Wt 190.2 lb

## 2014-04-09 DIAGNOSIS — E785 Hyperlipidemia, unspecified: Secondary | ICD-10-CM

## 2014-04-09 DIAGNOSIS — R0989 Other specified symptoms and signs involving the circulatory and respiratory systems: Secondary | ICD-10-CM

## 2014-04-09 DIAGNOSIS — F528 Other sexual dysfunction not due to a substance or known physiological condition: Secondary | ICD-10-CM

## 2014-04-09 DIAGNOSIS — Z0000 Encounter for general adult medical examination without abnormal findings: Secondary | ICD-10-CM

## 2014-04-09 DIAGNOSIS — R3129 Other microscopic hematuria: Secondary | ICD-10-CM | POA: Insufficient documentation

## 2014-04-09 DIAGNOSIS — R0683 Snoring: Secondary | ICD-10-CM

## 2014-04-09 DIAGNOSIS — D751 Secondary polycythemia: Secondary | ICD-10-CM | POA: Insufficient documentation

## 2014-04-09 DIAGNOSIS — R0609 Other forms of dyspnea: Secondary | ICD-10-CM

## 2014-04-09 HISTORY — DX: Hyperlipidemia, unspecified: E78.5

## 2014-04-09 MED ORDER — CETIRIZINE HCL 10 MG PO TABS: 10.0000 mg | ORAL_TABLET | Freq: Every day | ORAL | Status: DC

## 2014-04-09 MED ORDER — FLUTICASONE PROPIONATE 50 MCG/ACT NA SUSP: 2.0000 | Freq: Every day | NASAL | Status: DC

## 2014-04-09 MED ORDER — TADALAFIL 20 MG PO TABS: 20.0000 mg | ORAL_TABLET | Freq: Every day | ORAL | Status: DC | PRN
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
## 2014-04-09 NOTE — Assessment & Plan Note (Signed)
Mild, cont lower chol diet Lab Results  Component Value Date   LDLCALC 114* 04/08/2014

## 2014-04-09 NOTE — Assessment & Plan Note (Signed)

## 2014-04-09 NOTE — Assessment & Plan Note (Signed)
Asympt, etiology unclear, for urology referral 

## 2014-04-09 NOTE — Progress Notes (Signed)
Pre visit review using our clinic review tool, if applicable. No additional management support is needed unless otherwise documented below in the visit note. 

## 2014-04-09 NOTE — Assessment & Plan Note (Signed)
Ok to try cialis prn

## 2014-04-09 NOTE — Patient Instructions (Signed)
Please take all new medication as prescribed - the cialis  Please continue all other medications as before, and refills have been done if requested.  Please have the pharmacy call with any other refills you may need.  Please continue your efforts at being more active, low cholesterol diet, and weight control.  You are otherwise up to date with prevention measures today.  Please keep your appointments with your specialists as you may have planned  You will be contacted regarding the referral for: overnight oximetry per home health, and urology  Please return in 1 year for your yearly visit, or sooner if needed, with Lab testing done 3-5 days before

## 2014-04-09 NOTE — Assessment & Plan Note (Addendum)
Suspect most likely secondary, lots of snoring at night - ? OSA - has little daytime sleepiness, for ONO, may need pulm referral o rENT refral to r/o OSA

## 2014-04-11 ENCOUNTER — Other Ambulatory Visit: Payer: Self-pay | Admitting: Internal Medicine

## 2014-04-11 DIAGNOSIS — R0683 Snoring: Secondary | ICD-10-CM

## 2014-04-11 NOTE — Telephone Encounter (Signed)
received note per [employer], unable to do ONO due to insurance conflict

## 2014-04-18 ENCOUNTER — Telehealth: Payer: Self-pay | Admitting: Internal Medicine

## 2014-04-18 NOTE — Telephone Encounter (Signed)
Robin to inform pt - ONO (overnight oximetry) normal - no evidence for low oxygen or need for further eval or tx

## 2014-04-19 NOTE — Telephone Encounter (Signed)
Called the patients home number left a detailed message of results.

## 2014-04-19 NOTE — Telephone Encounter (Signed)
Called the patient cell number could not leave a message and patient not at work at this time.

## 2014-04-23 ENCOUNTER — Encounter: Payer: Self-pay | Admitting: Internal Medicine

## 2014-07-07 ENCOUNTER — Other Ambulatory Visit: Payer: Self-pay | Admitting: Internal Medicine

## 2015-04-15 ENCOUNTER — Encounter: Payer: 59 | Admitting: Internal Medicine

## 2015-06-21 ENCOUNTER — Other Ambulatory Visit: Payer: Self-pay | Admitting: Internal Medicine

## 2015-06-24 ENCOUNTER — Telehealth: Payer: Self-pay | Admitting: Internal Medicine

## 2015-06-24 DIAGNOSIS — Z0000 Encounter for general adult medical examination without abnormal findings: Secondary | ICD-10-CM

## 2015-06-24 NOTE — Telephone Encounter (Signed)
Pt has an appointment this Thursday 9/1 for his physical and his lab orders have expired. Can you please reorder them and call him and let him know they are in. You can leave a message on his vm if he doesn't answer

## 2015-06-24 NOTE — Telephone Encounter (Signed)
Labs ordered, pt advised via VM 

## 2015-06-25 ENCOUNTER — Other Ambulatory Visit (INDEPENDENT_AMBULATORY_CARE_PROVIDER_SITE_OTHER): Payer: 59

## 2015-06-25 DIAGNOSIS — Z0000 Encounter for general adult medical examination without abnormal findings: Secondary | ICD-10-CM

## 2015-06-25 LAB — LIPID PANEL
CHOLESTEROL: 137 mg/dL (ref 0–200)
HDL: 38.8 mg/dL — ABNORMAL LOW (ref >39.00)
LDL Cholesterol: 78 mg/dL (ref 0–99)
NONHDL: 98.55
Total CHOL/HDL Ratio: 4
Triglycerides: 103 mg/dL (ref 0.0–149.0)
VLDL: 20.6 mg/dL (ref 0.0–40.0)

## 2015-06-25 LAB — URINALYSIS, ROUTINE W REFLEX MICROSCOPIC
BILIRUBIN URINE: NEGATIVE
HGB URINE DIPSTICK: NEGATIVE
Ketones, ur: NEGATIVE
LEUKOCYTES UA: NEGATIVE
NITRITE: NEGATIVE
Specific Gravity, Urine: 1.02 (ref 1.000–1.030)
TOTAL PROTEIN, URINE-UPE24: NEGATIVE
URINE GLUCOSE: NEGATIVE
Urobilinogen, UA: 0.2 (ref 0.0–1.0)
WBC UA: NONE SEEN (ref 0–?)
pH: 7 (ref 5.0–8.0)

## 2015-06-25 LAB — CBC WITH DIFFERENTIAL/PLATELET
BASOS PCT: 0.5 % (ref 0.0–3.0)
Basophils Absolute: 0 10*3/uL (ref 0.0–0.1)
EOS PCT: 2.2 % (ref 0.0–5.0)
Eosinophils Absolute: 0.2 10*3/uL (ref 0.0–0.7)
HEMATOCRIT: 47.5 % (ref 39.0–52.0)
HEMOGLOBIN: 16.5 g/dL (ref 13.0–17.0)
Lymphocytes Relative: 40.4 % (ref 12.0–46.0)
Lymphs Abs: 3.6 10*3/uL (ref 0.7–4.0)
MCHC: 34.8 g/dL (ref 30.0–36.0)
MCV: 95.4 fl (ref 78.0–100.0)
MONOS PCT: 7.6 % (ref 3.0–12.0)
Monocytes Absolute: 0.7 10*3/uL (ref 0.1–1.0)
Neutro Abs: 4.4 10*3/uL (ref 1.4–7.7)
Neutrophils Relative %: 49.3 % (ref 43.0–77.0)
Platelets: 187 10*3/uL (ref 150.0–400.0)
RBC: 4.98 Mil/uL (ref 4.22–5.81)
RDW: 12 % (ref 11.5–15.5)
WBC: 8.9 10*3/uL (ref 4.0–10.5)

## 2015-06-25 LAB — BASIC METABOLIC PANEL
BUN: 11 mg/dL (ref 6–23)
CO2: 29 meq/L (ref 19–32)
Calcium: 9.2 mg/dL (ref 8.4–10.5)
Chloride: 104 mEq/L (ref 96–112)
Creatinine, Ser: 0.94 mg/dL (ref 0.40–1.50)
GFR: 96.34 mL/min (ref >60.00)
GLUCOSE: 91 mg/dL (ref 70–99)
POTASSIUM: 3.5 meq/L (ref 3.5–5.1)
Sodium: 139 mEq/L (ref 135–145)

## 2015-06-25 LAB — HEPATIC FUNCTION PANEL
ALBUMIN: 4.4 g/dL (ref 3.5–5.2)
ALT: 18 U/L (ref 0–53)
AST: 15 U/L (ref 0–37)
Alkaline Phosphatase: 78 U/L (ref 39–117)
Bilirubin, Direct: 0.1 mg/dL (ref 0.0–0.3)
TOTAL PROTEIN: 6.9 g/dL (ref 6.0–8.3)
Total Bilirubin: 0.9 mg/dL (ref 0.2–1.2)

## 2015-06-25 LAB — TSH: TSH: 3.55 u[IU]/mL (ref 0.35–4.50)

## 2015-06-26 ENCOUNTER — Encounter: Payer: Self-pay | Admitting: Internal Medicine

## 2015-06-26 ENCOUNTER — Ambulatory Visit (INDEPENDENT_AMBULATORY_CARE_PROVIDER_SITE_OTHER): Payer: 59 | Admitting: Internal Medicine

## 2015-06-26 VITALS — BP 120/82 | HR 63 | Temp 98.8°F | Ht 72.0 in | Wt 190.0 lb

## 2015-06-26 DIAGNOSIS — Z0000 Encounter for general adult medical examination without abnormal findings: Secondary | ICD-10-CM | POA: Diagnosis not present

## 2015-06-26 DIAGNOSIS — Z8371 Family history of colonic polyps: Secondary | ICD-10-CM | POA: Diagnosis not present

## 2015-06-26 DIAGNOSIS — Z83719 Family history of colon polyps, unspecified: Secondary | ICD-10-CM | POA: Insufficient documentation

## 2015-06-26 MED ORDER — TADALAFIL 20 MG PO TABS: 20.0000 mg | ORAL_TABLET | Freq: Every day | ORAL | Status: DC | PRN

## 2015-06-26 NOTE — Assessment & Plan Note (Signed)
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
## 2015-06-26 NOTE — Progress Notes (Signed)
Pre visit review using our clinic review tool, if applicable. No additional management support is needed unless otherwise documented below in the visit note. 

## 2015-06-26 NOTE — Patient Instructions (Signed)
Please continue all other medications as before, and refills have been done if requested - the cialis  Please have the pharmacy call with any other refills you may need.  Please continue your efforts at being more active, low cholesterol diet, and weight control.  You are otherwise up to date with prevention measures today.  Please keep your appointments with your specialists as you may have planned - Dermatology  Your lab work was OK today  You will be contacted regarding the referral for: Gastroenterology  Please return in 1 year for your yearly visit, or sooner if needed, with Lab testing done 3-5 days before

## 2015-07-07 ENCOUNTER — Encounter: Payer: Self-pay | Admitting: Gastroenterology

## 2015-08-26 ENCOUNTER — Ambulatory Visit: Payer: 59 | Admitting: Gastroenterology

## 2015-10-23 ENCOUNTER — Ambulatory Visit (INDEPENDENT_AMBULATORY_CARE_PROVIDER_SITE_OTHER): Payer: 59 | Admitting: Gastroenterology

## 2015-10-23 ENCOUNTER — Encounter: Payer: Self-pay | Admitting: Gastroenterology

## 2015-10-23 VITALS — BP 120/76 | HR 76 | Ht 71.0 in | Wt 210.2 lb

## 2015-10-23 DIAGNOSIS — Z800 Family history of malignant neoplasm of digestive organs: Secondary | ICD-10-CM | POA: Diagnosis not present

## 2015-10-23 NOTE — Patient Instructions (Signed)
Someone from [facility] will contact you with your appointment for Genetic counseling Recall colonoscopy in 4 years
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
## 2015-10-30 ENCOUNTER — Telehealth: Payer: Self-pay | Admitting: Genetic Counselor

## 2015-10-30 NOTE — Telephone Encounter (Signed)
Spoke with pt regarding referral, pt travels for work and will call when you gets back  Feb 21

## 2016-01-22 ENCOUNTER — Encounter (INDEPENDENT_AMBULATORY_CARE_PROVIDER_SITE_OTHER): Payer: Self-pay

## 2016-01-26 ENCOUNTER — Telehealth: Payer: Self-pay | Admitting: *Deleted

## 2016-01-26 MED ORDER — SILDENAFIL CITRATE 100 MG PO TABS: 100.0000 mg | ORAL_TABLET | Freq: Every day | ORAL | Status: DC | PRN

## 2016-01-26 NOTE — Telephone Encounter (Signed)
Received call pt states MD gave him rx for cialis but he would like to go back on the viagra. The cialis doesn't work for him. Verified pharmacy inform [facility].../lmb

## 2016-06-21 ENCOUNTER — Other Ambulatory Visit (INDEPENDENT_AMBULATORY_CARE_PROVIDER_SITE_OTHER): Payer: 59

## 2016-06-21 DIAGNOSIS — Z0000 Encounter for general adult medical examination without abnormal findings: Secondary | ICD-10-CM | POA: Diagnosis not present

## 2016-06-21 LAB — CBC WITH DIFFERENTIAL/PLATELET
BASOS ABS: 0.1 10*3/uL (ref 0.0–0.1)
BASOS PCT: 0.6 % (ref 0.0–3.0)
EOS ABS: 0.2 10*3/uL (ref 0.0–0.7)
Eosinophils Relative: 2.7 % (ref 0.0–5.0)
HEMATOCRIT: 47.3 % (ref 39.0–52.0)
HEMOGLOBIN: 16.5 g/dL (ref 13.0–17.0)
LYMPHS PCT: 35.1 % (ref 12.0–46.0)
Lymphs Abs: 2.8 10*3/uL (ref 0.7–4.0)
MCHC: 35 g/dL (ref 30.0–36.0)
MCV: 94 fl (ref 78.0–100.0)
MONOS PCT: 7.3 % (ref 3.0–12.0)
Monocytes Absolute: 0.6 10*3/uL (ref 0.1–1.0)
Neutro Abs: 4.3 10*3/uL (ref 1.4–7.7)
Neutrophils Relative %: 54.3 % (ref 43.0–77.0)
Platelets: 200 10*3/uL (ref 150.0–400.0)
RBC: 5.03 Mil/uL (ref 4.22–5.81)
RDW: 11.9 % (ref 11.5–15.5)
WBC: 7.9 10*3/uL (ref 4.0–10.5)

## 2016-06-21 LAB — BASIC METABOLIC PANEL
BUN: 13 mg/dL (ref 6–23)
CALCIUM: 9 mg/dL (ref 8.4–10.5)
CHLORIDE: 108 meq/L (ref 96–112)
CO2: 26 mEq/L (ref 19–32)
CREATININE: 1.04 mg/dL (ref 0.40–1.50)
GFR: 85.27 mL/min (ref >60.00)
Glucose, Bld: 98 mg/dL (ref 70–99)
Potassium: 3.7 mEq/L (ref 3.5–5.1)
Sodium: 141 mEq/L (ref 135–145)

## 2016-06-21 LAB — URINALYSIS, ROUTINE W REFLEX MICROSCOPIC
Ketones, ur: NEGATIVE
Leukocytes, UA: NEGATIVE
NITRITE: NEGATIVE
Specific Gravity, Urine: 1.025 (ref 1.000–1.030)
TOTAL PROTEIN, URINE-UPE24: NEGATIVE
URINE GLUCOSE: NEGATIVE
UROBILINOGEN UA: 0.2 (ref 0.0–1.0)
pH: 6 (ref 5.0–8.0)

## 2016-06-21 LAB — LIPID PANEL
CHOL/HDL RATIO: 4
Cholesterol: 168 mg/dL (ref 0–200)
HDL: 44.2 mg/dL (ref >39.00)
LDL CALC: 101 mg/dL — AB (ref 0–99)
NONHDL: 123.82
TRIGLYCERIDES: 116 mg/dL (ref 0.0–149.0)
VLDL: 23.2 mg/dL (ref 0.0–40.0)

## 2016-06-21 LAB — HEPATIC FUNCTION PANEL
ALT: 21 U/L (ref 0–53)
AST: 14 U/L (ref 0–37)
Albumin: 4.4 g/dL (ref 3.5–5.2)
Alkaline Phosphatase: 74 U/L (ref 39–117)
BILIRUBIN DIRECT: 0.1 mg/dL (ref 0.0–0.3)
BILIRUBIN TOTAL: 0.7 mg/dL (ref 0.2–1.2)
Total Protein: 7 g/dL (ref 6.0–8.3)

## 2016-06-21 LAB — TSH: TSH: 1.72 u[IU]/mL (ref 0.35–4.50)

## 2016-06-29 ENCOUNTER — Other Ambulatory Visit (INDEPENDENT_AMBULATORY_CARE_PROVIDER_SITE_OTHER): Payer: 59

## 2016-06-29 ENCOUNTER — Other Ambulatory Visit: Payer: Self-pay | Admitting: Internal Medicine

## 2016-06-29 ENCOUNTER — Ambulatory Visit (INDEPENDENT_AMBULATORY_CARE_PROVIDER_SITE_OTHER): Payer: 59 | Admitting: Internal Medicine

## 2016-06-29 ENCOUNTER — Encounter: Payer: Self-pay | Admitting: Internal Medicine

## 2016-06-29 VITALS — BP 118/72 | HR 69 | Temp 98.2°F | Resp 20 | Wt 199.0 lb

## 2016-06-29 DIAGNOSIS — R7989 Other specified abnormal findings of blood chemistry: Secondary | ICD-10-CM

## 2016-06-29 DIAGNOSIS — F528 Other sexual dysfunction not due to a substance or known physiological condition: Secondary | ICD-10-CM

## 2016-06-29 DIAGNOSIS — R6889 Other general symptoms and signs: Secondary | ICD-10-CM

## 2016-06-29 DIAGNOSIS — N5089 Other specified disorders of the male genital organs: Secondary | ICD-10-CM

## 2016-06-29 DIAGNOSIS — M25562 Pain in left knee: Secondary | ICD-10-CM

## 2016-06-29 DIAGNOSIS — Z0001 Encounter for general adult medical examination with abnormal findings: Secondary | ICD-10-CM

## 2016-06-29 DIAGNOSIS — M25472 Effusion, left ankle: Secondary | ICD-10-CM | POA: Insufficient documentation

## 2016-06-29 DIAGNOSIS — N509 Disorder of male genital organs, unspecified: Secondary | ICD-10-CM

## 2016-06-29 DIAGNOSIS — M545 Low back pain, unspecified: Secondary | ICD-10-CM | POA: Insufficient documentation

## 2016-06-29 LAB — TESTOSTERONE: Testosterone: 225.44 ng/dL — ABNORMAL LOW (ref 300.00–890.00)

## 2016-06-29 LAB — PSA: PSA: 1.23 ng/mL (ref 0.10–4.00)

## 2016-06-29 NOTE — Progress Notes (Signed)
Pre visit review using our clinic review tool, if applicable. No additional management support is needed unless otherwise documented below in the visit note. 
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
## 2016-06-29 NOTE — Patient Instructions (Signed)
Please continue all other medications as before, and refills have been done if requested.  Please have the pharmacy call with any other refills you may need.  Please continue your efforts at being more active, low cholesterol diet, and weight control.  You are otherwise up to date with prevention measures today.  Please keep your appointments with your specialists as you may have planned  You will be contacted regarding the referral for: ultrasound (scrotal), and Dr Tamala Julian for the lower back, left knee and feet  Please go to the LAB in the Basement (turn left off the elevator) for the tests to be done today  You will be contacted by phone if any changes need to be made immediately.  Otherwise, you will receive a letter about your results with an explanation, but please check with MyChart first.  Please remember to sign up for MyChart if you have not done so, as this will be important to you in the future with finding out test results, communicating by private email, and scheduling acute appointments online when needed.  Please return in 1 year for your yearly visit, or sooner if needed, with Lab testing done 3-5 days before

## 2016-06-30 ENCOUNTER — Other Ambulatory Visit (INDEPENDENT_AMBULATORY_CARE_PROVIDER_SITE_OTHER): Payer: 59

## 2016-06-30 ENCOUNTER — Encounter: Payer: Self-pay | Admitting: Internal Medicine

## 2016-06-30 DIAGNOSIS — R7989 Other specified abnormal findings of blood chemistry: Secondary | ICD-10-CM

## 2016-06-30 DIAGNOSIS — E291 Testicular hypofunction: Secondary | ICD-10-CM | POA: Diagnosis not present

## 2016-06-30 LAB — FOLLICLE STIMULATING HORMONE: FSH: 6.1 m[IU]/mL (ref 1.4–18.1)

## 2016-06-30 LAB — IBC PANEL
IRON: 105 ug/dL (ref 42–165)
SATURATION RATIOS: 33.8 % (ref 20.0–50.0)
TRANSFERRIN: 222 mg/dL (ref 212.0–360.0)

## 2016-06-30 LAB — LUTEINIZING HORMONE: LH: 7.64 m[IU]/mL (ref 1.50–9.30)

## 2016-07-01 LAB — PROLACTIN: PROLACTIN: 13.3 ng/mL (ref 2.0–18.0)

## 2016-07-04 NOTE — Assessment & Plan Note (Signed)

## 2016-07-04 NOTE — Assessment & Plan Note (Signed)
Mild to mod, for pain control,  to f/u any worsening symptoms or concerns, also refer sports medicine

## 2016-07-04 NOTE — Assessment & Plan Note (Signed)
Ok for viagra refills

## 2016-07-04 NOTE — Assessment & Plan Note (Addendum)
Mild to mod, for scrotal u/s,  to f/u any worsening symptoms or concerns, also refer sports medicine  In addition to the time spent performing CPE, I spent an additional 25 minutes face to face,in which greater than 50% of this time was spent in counseling and coordination of care for patient's acute illness as documented.

## 2016-07-07 ENCOUNTER — Other Ambulatory Visit: Payer: Self-pay | Admitting: Internal Medicine

## 2016-07-07 ENCOUNTER — Telehealth: Payer: Self-pay | Admitting: *Deleted

## 2016-07-07 DIAGNOSIS — N5089 Other specified disorders of the male genital organs: Secondary | ICD-10-CM

## 2016-07-07 NOTE — Telephone Encounter (Signed)
Left msg on triage stating needing an additional order added. Pt is schedule for U/S [place]. Also need order for Arterial U/S doppler order # [id number]...Douglas Gibbs

## 2016-07-07 NOTE — Telephone Encounter (Signed)
This is done.

## 2016-07-13 ENCOUNTER — Other Ambulatory Visit: Payer: Self-pay

## 2016-07-13 ENCOUNTER — Ambulatory Visit (INDEPENDENT_AMBULATORY_CARE_PROVIDER_SITE_OTHER): Payer: 59 | Admitting: Family Medicine

## 2016-07-13 ENCOUNTER — Encounter: Payer: Self-pay | Admitting: Family Medicine

## 2016-07-13 VITALS — BP 116/82 | HR 80 | Ht 72.0 in | Wt 203.0 lb

## 2016-07-13 DIAGNOSIS — M6789 Other specified disorders of synovium and tendon, multiple sites: Secondary | ICD-10-CM | POA: Diagnosis not present

## 2016-07-13 DIAGNOSIS — M25572 Pain in left ankle and joints of left foot: Secondary | ICD-10-CM | POA: Diagnosis not present

## 2016-07-13 DIAGNOSIS — M216X9 Other acquired deformities of unspecified foot: Secondary | ICD-10-CM | POA: Diagnosis not present

## 2016-07-13 DIAGNOSIS — M2142 Flat foot [pes planus] (acquired), left foot: Secondary | ICD-10-CM

## 2016-07-13 DIAGNOSIS — M76829 Posterior tibial tendinitis, unspecified leg: Secondary | ICD-10-CM | POA: Insufficient documentation

## 2016-07-13 DIAGNOSIS — M2141 Flat foot [pes planus] (acquired), right foot: Secondary | ICD-10-CM | POA: Insufficient documentation

## 2016-07-13 MED ORDER — DICLOFENAC SODIUM 2 % TD SOLN: 2.0000 "application " | Freq: Two times a day (BID) | TRANSDERMAL | 3 refills | Status: DC

## 2016-07-13 NOTE — Assessment & Plan Note (Signed)
Patient does have some flatfeet as well as posterior tibialis insufficiency. Patient will try topical anti-inflammatories, compression, over-the-counter orthotics, home exercises. I spent patient to do well with conservative therapy. Worsening symptoms we can consider nitroglycerin patches as well as formal physical therapy. Patient come back and see me again in 4 weeks.
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
## 2016-07-13 NOTE — Patient Instructions (Signed)
Good to see you.  If in pain ice 10 minutes in a ice bath at night Avoid being barefoot.  Spenco orthotics "total support" online would be great  pennsaid pinkie amount topically 2 times daily as needed.  Exercises 3 times a week.  Brooks, [place], New Balance or any other neutral shoe would be good.  Oofos or Hoka sandals in the house could be good.  Body [email or web address] for a thigh compression sleeve if you need something.  See me again in 4 weeks if still having trouble and we can discuss custom orthotics or other therapies.

## 2016-07-20 ENCOUNTER — Encounter: Payer: Self-pay | Admitting: Internal Medicine

## 2016-07-20 ENCOUNTER — Encounter: Payer: Self-pay | Admitting: Family Medicine

## 2016-07-20 ENCOUNTER — Other Ambulatory Visit: Payer: Self-pay | Admitting: Internal Medicine

## 2016-07-20 DIAGNOSIS — E291 Testicular hypofunction: Secondary | ICD-10-CM

## 2016-07-20 MED ORDER — TESTOSTERONE CYPIONATE 200 MG/ML IM SOLN: 100.0000 mg | INTRAMUSCULAR | 5 refills | Status: DC

## 2016-07-20 NOTE — Telephone Encounter (Signed)
Douglas Gibbs - see above  Done hardcopy to Douglas Gibbs -

## 2016-07-21 ENCOUNTER — Telehealth: Payer: Self-pay

## 2016-07-21 ENCOUNTER — Ambulatory Visit
Admission: RE | Admit: 2016-07-21 | Discharge: 2016-07-21 | Disposition: A | Discharge status: Home / Self Care | Payer: 59 | Source: Ambulatory Visit | Attending: Internal Medicine | Admitting: Internal Medicine

## 2016-07-21 DIAGNOSIS — N5089 Other specified disorders of the male genital organs: Secondary | ICD-10-CM

## 2016-07-21 NOTE — Telephone Encounter (Signed)
A user error has taken place: ERROR °

## 2016-07-22 ENCOUNTER — Telehealth: Payer: Self-pay

## 2016-07-22 NOTE — Telephone Encounter (Signed)
PA initiated via CoverMyMeds key [id number]

## 2016-07-23 NOTE — Telephone Encounter (Signed)
PA APPROVED via CoverMyMeds. Pharmacy to be advised via CoverMyMeds

## 2016-08-09 ENCOUNTER — Ambulatory Visit: Payer: 59 | Admitting: Family Medicine

## 2016-09-06 ENCOUNTER — Other Ambulatory Visit (INDEPENDENT_AMBULATORY_CARE_PROVIDER_SITE_OTHER): Payer: 59

## 2016-09-06 DIAGNOSIS — E291 Testicular hypofunction: Secondary | ICD-10-CM | POA: Diagnosis not present

## 2016-09-06 LAB — TESTOSTERONE: Testosterone: 275.47 ng/dL — ABNORMAL LOW (ref 300.00–890.00)

## 2016-09-08 ENCOUNTER — Encounter: Payer: Self-pay | Admitting: Internal Medicine

## 2016-09-08 DIAGNOSIS — E291 Testicular hypofunction: Secondary | ICD-10-CM

## 2016-09-08 NOTE — Progress Notes (Unsigned)
Left message on machine for pt to return my call  

## 2016-10-11 ENCOUNTER — Encounter: Payer: Self-pay | Admitting: Internal Medicine

## 2016-10-11 DIAGNOSIS — E291 Testicular hypofunction: Secondary | ICD-10-CM

## 2016-10-12 NOTE — Telephone Encounter (Signed)
referrall done to endo, pt notified by [employer]

## 2016-10-12 NOTE — Addendum Note (Signed)
Addended by: Biagio Borg on: 10/12/2016 01:49 PM   Modules accepted: Orders

## 2016-10-29 ENCOUNTER — Telehealth: Payer: Self-pay | Admitting: Emergency Medicine

## 2016-10-29 ENCOUNTER — Other Ambulatory Visit (INDEPENDENT_AMBULATORY_CARE_PROVIDER_SITE_OTHER): Payer: 59

## 2016-10-29 DIAGNOSIS — E291 Testicular hypofunction: Secondary | ICD-10-CM | POA: Diagnosis not present

## 2016-10-29 LAB — TESTOSTERONE: Testosterone: 494.58 ng/dL (ref 300.00–890.00)

## 2016-10-29 NOTE — Telephone Encounter (Signed)
Pt called and stated he had his Flu Shot 07/18/16 at [facility]. Can we please have this taken off the to do list on mychart as well. Thanks.

## 2016-10-29 NOTE — Telephone Encounter (Signed)
This has been done.

## 2016-11-04 ENCOUNTER — Encounter: Payer: Self-pay | Admitting: Internal Medicine

## 2016-11-04 DIAGNOSIS — E291 Testicular hypofunction: Secondary | ICD-10-CM

## 2016-11-05 NOTE — Addendum Note (Signed)
Addended by: Biagio Borg on: 11/05/2016 12:26 PM   Modules accepted: Orders

## 2016-11-17 ENCOUNTER — Telehealth: Payer: Self-pay | Admitting: Emergency Medicine

## 2016-11-17 NOTE — Telephone Encounter (Signed)
Called pt and left VM for patient to call back and schedule OV with Dr Jenny Reichmann for Endo referral. Thanks.

## 2016-12-08 DIAGNOSIS — H5212 Myopia, left eye: Secondary | ICD-10-CM | POA: Diagnosis not present

## 2016-12-08 DIAGNOSIS — H52223 Regular astigmatism, bilateral: Secondary | ICD-10-CM | POA: Diagnosis not present

## 2016-12-22 ENCOUNTER — Encounter: Payer: Self-pay | Admitting: Internal Medicine

## 2016-12-24 ENCOUNTER — Encounter: Payer: Self-pay | Admitting: Internal Medicine

## 2016-12-24 ENCOUNTER — Ambulatory Visit (INDEPENDENT_AMBULATORY_CARE_PROVIDER_SITE_OTHER): Payer: 59 | Admitting: Internal Medicine

## 2016-12-24 ENCOUNTER — Other Ambulatory Visit (INDEPENDENT_AMBULATORY_CARE_PROVIDER_SITE_OTHER): Payer: 59

## 2016-12-24 DIAGNOSIS — E291 Testicular hypofunction: Secondary | ICD-10-CM

## 2016-12-24 LAB — CBC WITH DIFFERENTIAL/PLATELET
Basophils Absolute: 0.1 10*3/uL (ref 0.0–0.1)
Basophils Relative: 0.7 % (ref 0.0–3.0)
EOS PCT: 2.2 % (ref 0.0–5.0)
Eosinophils Absolute: 0.2 10*3/uL (ref 0.0–0.7)
HCT: 45.1 % (ref 39.0–52.0)
Hemoglobin: 16 g/dL (ref 13.0–17.0)
LYMPHS ABS: 3.5 10*3/uL (ref 0.7–4.0)
Lymphocytes Relative: 42.3 % (ref 12.0–46.0)
MCHC: 35.4 g/dL (ref 30.0–36.0)
MCV: 95 fl (ref 78.0–100.0)
MONOS PCT: 7.2 % (ref 3.0–12.0)
Monocytes Absolute: 0.6 10*3/uL (ref 0.1–1.0)
NEUTROS ABS: 3.9 10*3/uL (ref 1.4–7.7)
NEUTROS PCT: 47.6 % (ref 43.0–77.0)
PLATELETS: 185 10*3/uL (ref 150.0–400.0)
RBC: 4.75 Mil/uL (ref 4.22–5.81)
RDW: 12.4 % (ref 11.5–15.5)
WBC: 8.2 10*3/uL (ref 4.0–10.5)

## 2016-12-24 MED ORDER — CLOMIPHENE CITRATE 50 MG PO TABS: 50.0000 mg | ORAL_TABLET | Freq: Every day | ORAL | 3 refills | Status: DC

## 2016-12-24 NOTE — Patient Instructions (Addendum)
OK to hold on further testosterone injections and the endo referral  Please take all new medication as prescribed - the clomid mg per day  Please continue all other medications as before, and refills have been done if requested.  Please have the pharmacy call with any other refills you may need.  Please keep your appointments with your specialists as you may have planned  Please go to the LAB in the Basement (turn left off the elevator) for the tests to be done today  Then please return to the LAB only for cbc and testosterone level in 2 months (no appt needed)  You will be contacted by phone if any changes need to be made immediately.  Otherwise, you will receive a letter about your results with an explanation, but please check with MyChart first.  Please remember to sign up for MyChart if you have not done so, as this will be important to you in the future with finding out test results, communicating by private email, and scheduling acute appointments online when needed.  Please return in 6 months, or sooner if needed, with Lab testing done 3-5 days before
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
## 2016-12-25 NOTE — Assessment & Plan Note (Signed)
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

## 2017-02-09 ENCOUNTER — Encounter: Payer: Self-pay | Admitting: Internal Medicine

## 2017-03-17 ENCOUNTER — Other Ambulatory Visit (INDEPENDENT_AMBULATORY_CARE_PROVIDER_SITE_OTHER): Payer: 59

## 2017-03-17 DIAGNOSIS — E291 Testicular hypofunction: Secondary | ICD-10-CM

## 2017-03-17 LAB — CBC WITH DIFFERENTIAL/PLATELET
BASOS ABS: 0.1 10*3/uL (ref 0.0–0.1)
Basophils Relative: 0.6 % (ref 0.0–3.0)
Eosinophils Absolute: 0.2 10*3/uL (ref 0.0–0.7)
Eosinophils Relative: 1.9 % (ref 0.0–5.0)
HEMATOCRIT: 45.2 % (ref 39.0–52.0)
Hemoglobin: 15.8 g/dL (ref 13.0–17.0)
LYMPHS PCT: 31.7 % (ref 12.0–46.0)
Lymphs Abs: 3 10*3/uL (ref 0.7–4.0)
MCHC: 35 g/dL (ref 30.0–36.0)
MCV: 94.6 fl (ref 78.0–100.0)
MONOS PCT: 6.9 % (ref 3.0–12.0)
Monocytes Absolute: 0.7 10*3/uL (ref 0.1–1.0)
NEUTROS PCT: 58.9 % (ref 43.0–77.0)
Neutro Abs: 5.6 10*3/uL (ref 1.4–7.7)
Platelets: 187 10*3/uL (ref 150.0–400.0)
RBC: 4.78 Mil/uL (ref 4.22–5.81)
RDW: 12.6 % (ref 11.5–15.5)
WBC: 9.5 10*3/uL (ref 4.0–10.5)

## 2017-03-17 LAB — TESTOSTERONE: Testosterone: 294.62 ng/dL — ABNORMAL LOW (ref 300.00–890.00)

## 2017-03-19 ENCOUNTER — Other Ambulatory Visit: Payer: Self-pay | Admitting: Internal Medicine

## 2017-04-08 IMAGING — US US ART/VEN ABD/PELV/SCROTUM DOPPLER LTD
1 series · 13 of 25 positions shown · non-contrast
Comparison: CT scan 05/08/2014

CLINICAL DATA: Right scrotal mass.

EXAM:
SCROTAL ULTRASOUND
DOPPLER ULTRASOUND OF THE TESTICLES
TECHNIQUE: Complete ultrasound examination of the testicles, epididymis, and
other scrotal structures was performed. Color and spectral Doppler
ultrasound were also utilized to evaluate blood flow to the
testicles.

[Series 1: us art/ven abd/pelv/scrotum doppler ltd · 0.08mm/px · 13 of 84 slices shown]
[im 1/84]
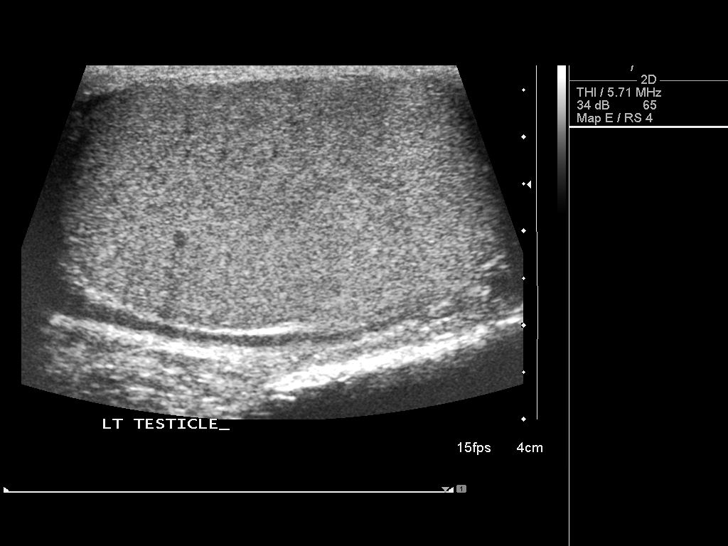
[im 7/84]
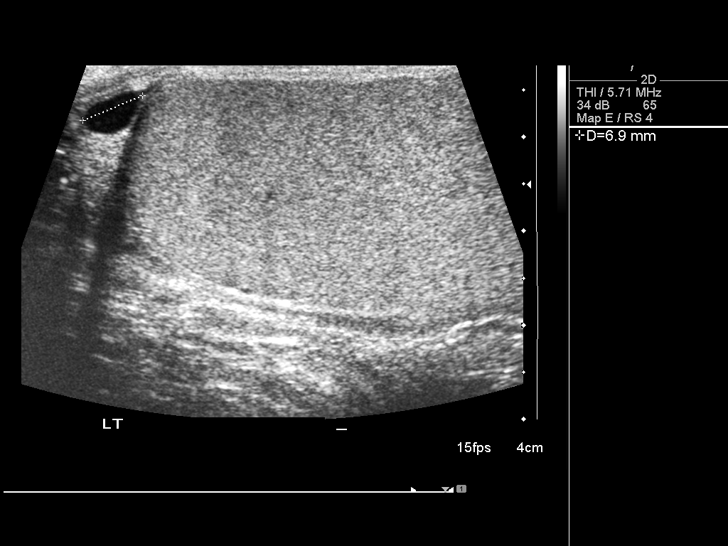
[im 14/84]
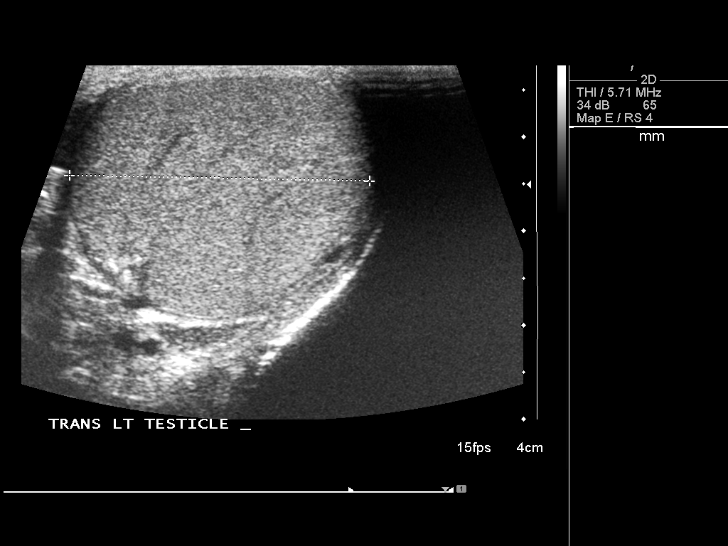
[im 21/84]
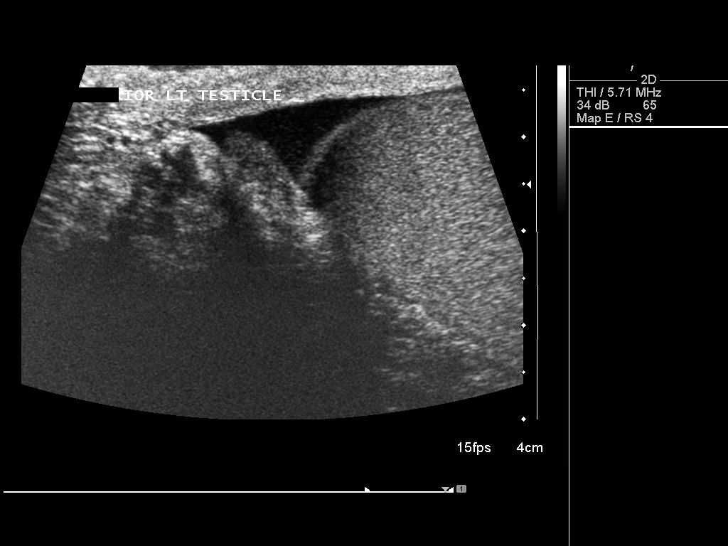
[im 28/84]
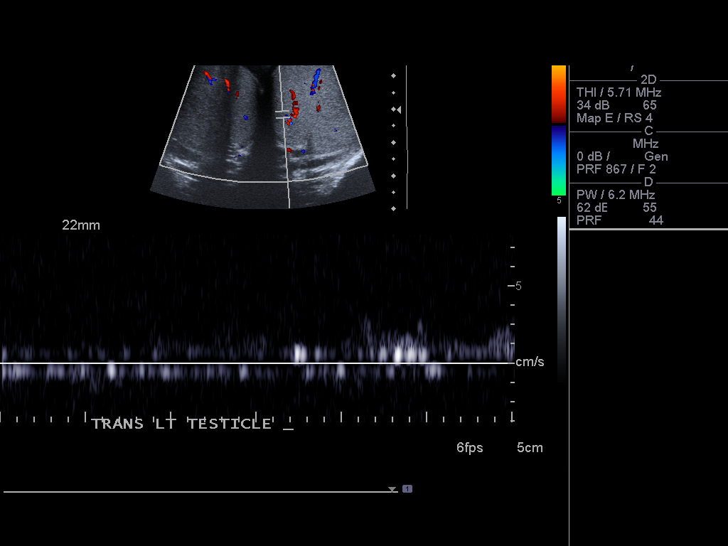
[im 35/84]
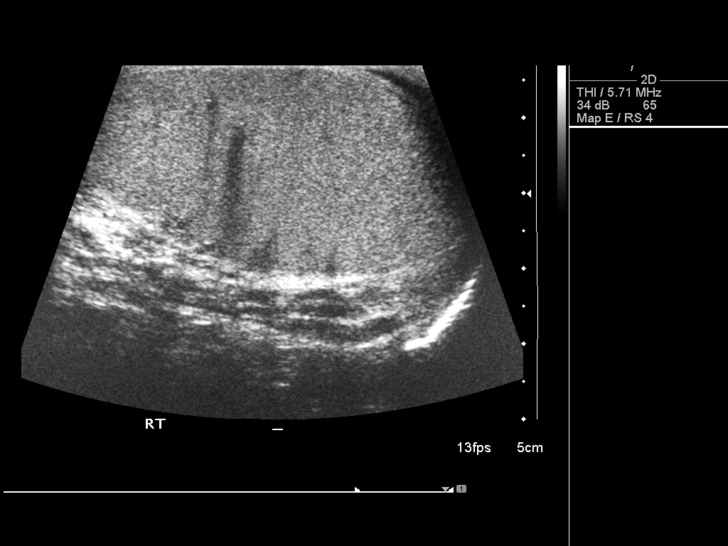
[im 42/84]
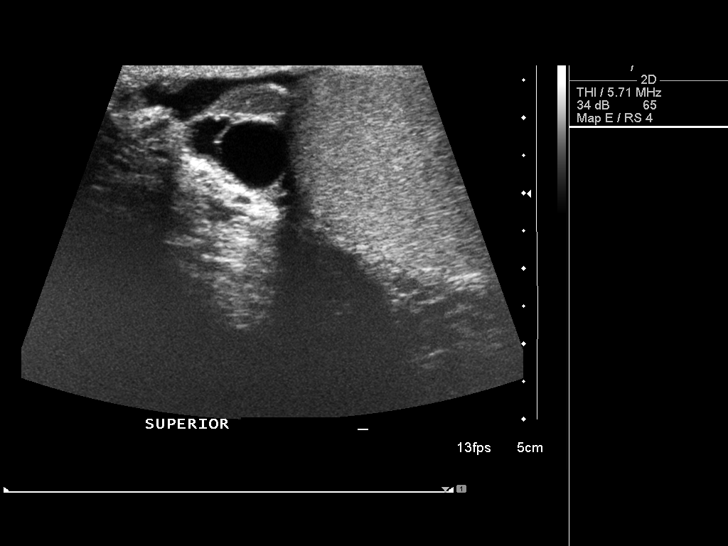
[im 49/84]
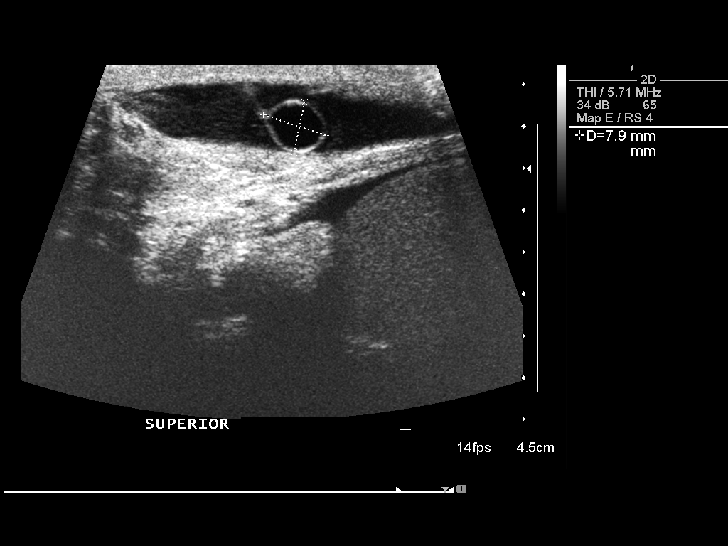
[im 56/84]
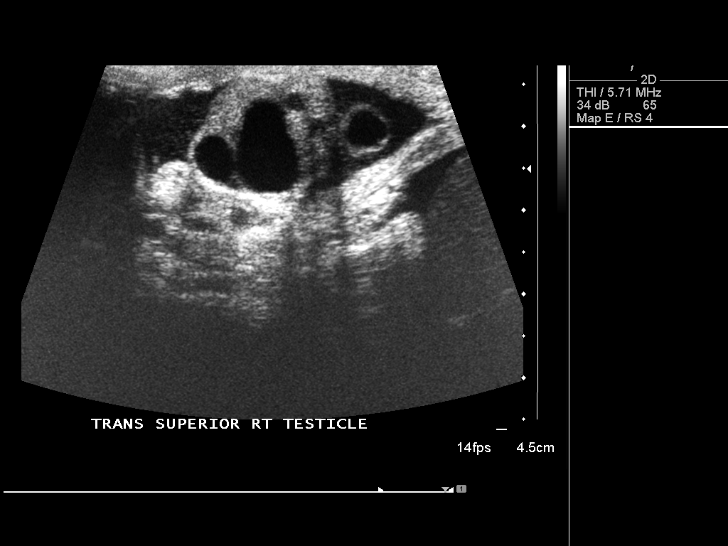
[im 63/84]
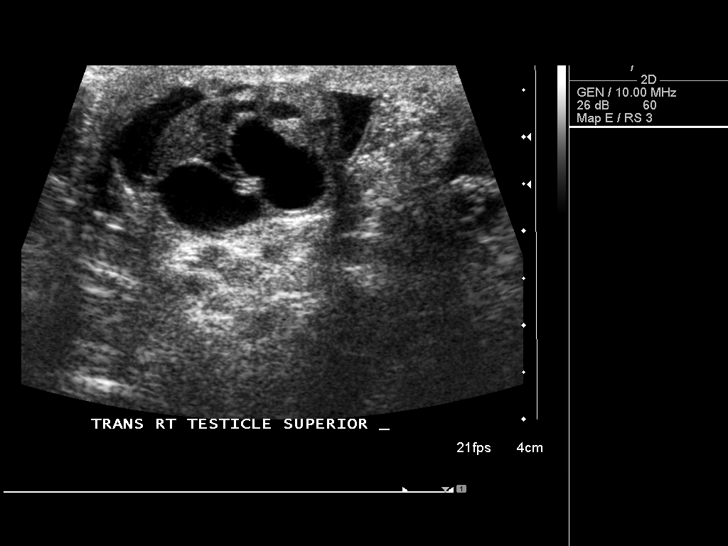
[im 70/84]
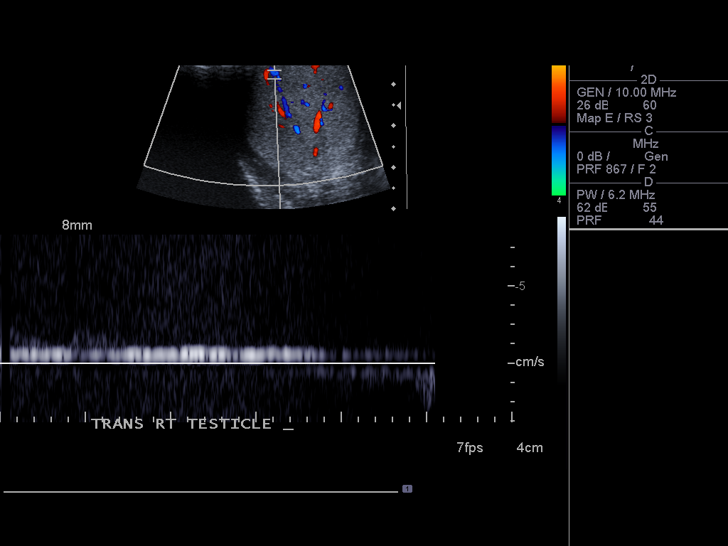
[im 77/84]
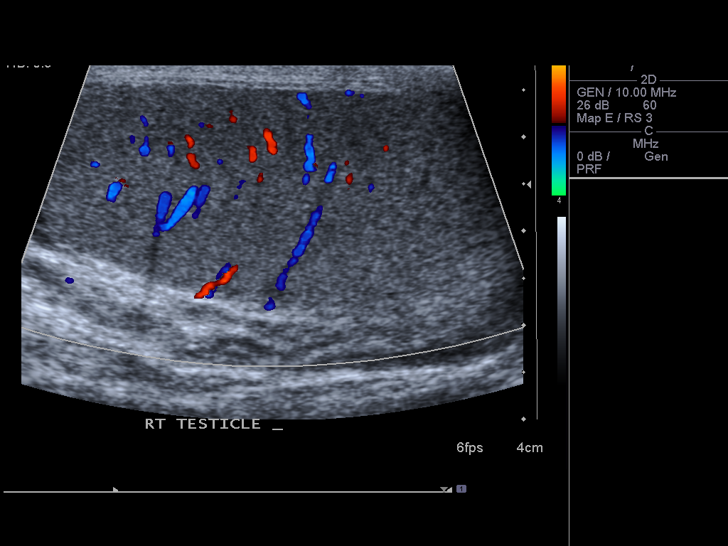
[im 84/84]
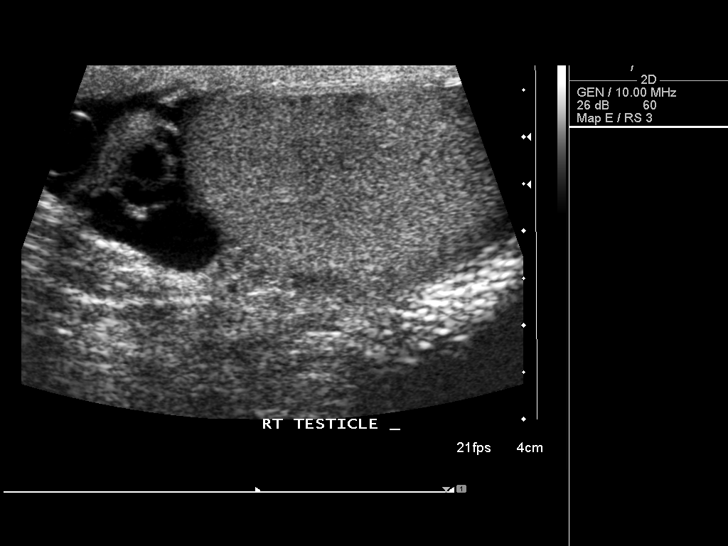

[13 of 25 positions shown; findings below may reference images not displayed]

FINDINGS: Right testicle

Measurements: 5.4 x 2.6 x 3.4 cm. Symmetric and homogeneous
echotexture without focal lesion. Patent arterial and venous blood
flow.

Left testicle

Measurements: 4.8 x 2.6 x 3.2 cm. Symmetric and homogeneous
echotexture without focal lesion. Patent arterial and venous blood
flow.

Right epididymis: Multiple epididymal cysts. The largest measures
1.6 x 1.1 x 1.4 cm. There is some surrounding epididymal fluid also.
This likely corresponds to the patient's palpable abnormality.

Left epididymis:  Small (7 mm) epididymal cyst.

Hydrocele:  Small bilateral hydroceles.

Varicocele:  None visualized.

Pulsed Doppler interrogation of both testes demonstrates normal low
resistance arterial and venous waveforms bilaterally.
IMPRESSION: 1. Normal sonographic appearance of both testicles. No testicular
mass. Patent blood flow bilaterally.
2. Cluster of multiple epididymal cysts on the right side likely
accounting for the patient's palpable abnormality. Small epididymal
cysts on the left.
3. Small bilateral hydroceles.

## 2017-07-01 ENCOUNTER — Encounter: Payer: 59 | Admitting: Internal Medicine

## 2017-07-25 ENCOUNTER — Telehealth: Payer: Self-pay | Admitting: Internal Medicine

## 2017-07-27 ENCOUNTER — Telehealth: Payer: Self-pay | Admitting: Family Medicine

## 2017-07-27 NOTE — Telephone Encounter (Signed)
-----   Message from Biagio Borg, MD sent at 07/25/2017  3:12 PM EDT ----- Contact: [phone] Ok with me, thanks ----- Message ----- From: Francine Graven Sent: 07/25/2017   2:38 PM To: Biagio Borg, MD, Marin Olp, MD, #  Patient would like to transfer care from Dr. Jenny Reichmann to Dr. Yong Channel at [facility]. Patient states that [place] is a much closer location and that Dr. Yong Channel currently sees his girlfriend. Do you both approve this transfer?

## 2017-07-27 NOTE — Telephone Encounter (Signed)
-----   Message from Marin Olp, MD sent at 07/25/2017  6:15 PM EDT ----- Contact: [phone] I would be happy to see him. Please let him know that I would likely refer to urology for management of hypogonadism/low testosterone. That may affect his decision.   Garret Reddish  ----- Message ----- From: Francine Graven Sent: 07/25/2017   2:38 PM To: Biagio Borg, MD, Marin Olp, MD, #  Patient would like to transfer care from Dr. Jenny Reichmann to Dr. Yong Channel at [facility]. Patient states that [place] is a much closer location and that Dr. Yong Channel currently sees his girlfriend. Do you both approve this transfer?

## 2017-07-27 NOTE — Telephone Encounter (Signed)
Please see Dr. Ansel Bong note about scheduling new patient appointment. If patient ok with Urology referral we can schedule with Dr. Yong Channel.

## 2017-08-30 ENCOUNTER — Encounter: Payer: Self-pay | Admitting: Family Medicine

## 2017-08-30 ENCOUNTER — Ambulatory Visit (INDEPENDENT_AMBULATORY_CARE_PROVIDER_SITE_OTHER): Payer: 59 | Admitting: Family Medicine

## 2017-08-30 VITALS — BP 110/82 | HR 87 | Temp 98.4°F | Ht 72.0 in | Wt 203.2 lb

## 2017-08-30 DIAGNOSIS — E291 Testicular hypofunction: Secondary | ICD-10-CM | POA: Diagnosis not present

## 2017-08-30 DIAGNOSIS — F528 Other sexual dysfunction not due to a substance or known physiological condition: Secondary | ICD-10-CM

## 2017-08-30 DIAGNOSIS — N5089 Other specified disorders of the male genital organs: Secondary | ICD-10-CM

## 2017-08-30 DIAGNOSIS — N509 Disorder of male genital organs, unspecified: Secondary | ICD-10-CM | POA: Diagnosis not present

## 2017-08-30 DIAGNOSIS — D751 Secondary polycythemia: Secondary | ICD-10-CM

## 2017-08-30 DIAGNOSIS — Z87891 Personal history of nicotine dependence: Secondary | ICD-10-CM | POA: Insufficient documentation

## 2017-08-30 MED ORDER — SILDENAFIL CITRATE 20 MG PO TABS: ORAL_TABLET | ORAL | 3 refills | Status: DC
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
## 2017-08-30 NOTE — Patient Instructions (Signed)
Refer to urology under scrotal mass (epididymal cysts most likely) and hypogonadism  Trial sildenafil 20mg . Use 2-5 tablets. You may actually be ok with 1 tablet. If this is too expensive at outpatient send me mychart message and we can send to [facility] in [place]

## 2017-08-30 NOTE — Assessment & Plan Note (Addendum)
Scrotal mass S: Patient diagnosed with hypogonadism/low testosterone 06/2016. He has normal levels of prolactin, fsh, lh (though granted these were not elevated despite low testosterone. All results available in epic- patient also usually has them on phone through [facility]. He was treated for 2 months with testosterone. He preferred to get more information about possible causes. He was referred to endocrine. Per Dr. Loanne Drilling "Please decline, as pt has h/o polycythemia, and is therefore not a candidate for testosterone therapy. Urology sounds more appropriate, given his other hx. " Polycythemia was on one occasion in 2015 in time he was doing a lot of skiing. Had negative sleep apnea eval and for last 3 years CBC has been normal. Because of this note appears Dr. Jenny Reichmann stopped his testosterone. He was referred to a different endocrine group but for unclear reasons this referral was cancelled.   Patient has grown very frustrated by this process and feeling poorly. His libido, energy levels, sex drive increased on testosterone. He was later tried on clomid which did not help him with his symptoms.   He also had a scrotal mass 06/2016 and ultrasound showed  "1. Normal sonographic appearance of both testicles. No testicular mass. Patent blood flow bilaterally. 2. Cluster of multiple epididymal cysts on the right side likely accounting for the patient's palpable abnormality. Small epididymal cysts on the left. 3. Small bilateral hydroceles." Patient would like more information on this as well as he feels dull 1-2/10 aching pain and area seems to be enlarging- wants to consider surgery.  A/P:  I do not think a one time elevated hemoglobin while patient was at high altitudes should preclude him from every being on testosterone- would be more of a concern if this was a consistent issue while on testosterone.   Has not had free/total levels so will check this 8-9 Am fasting as well as cbc. He will have these  available by time of referral visit.   I discussed getting a different endocrinologists opinion vs. Urology. Patient prefers urology as also wants to discuss aching right side of scrotum where epididymal cysts are. I have placed referral today. Extensive counseling for patient was provided today on these topics.

## 2017-08-30 NOTE — Assessment & Plan Note (Signed)
S: patient has been using viagra prn but cost is high for him. He wanted to discuss sildenafil/revatio. Sometimes he has success with as small as 1/4 viagra pill A/P: filled sildenafil for him and discussed could trial as low as 1 tablet potentially. Try outpatient center- send to [facility] if not cost effective and discussed that urology may have for even cheaper

## 2017-09-02 NOTE — Telephone Encounter (Signed)
Erroneous encounter

## 2017-09-09 ENCOUNTER — Other Ambulatory Visit (INDEPENDENT_AMBULATORY_CARE_PROVIDER_SITE_OTHER): Payer: 59

## 2017-09-09 DIAGNOSIS — E291 Testicular hypofunction: Secondary | ICD-10-CM | POA: Diagnosis not present

## 2017-09-09 LAB — CBC
HCT: 49.4 % (ref 39.0–52.0)
HEMOGLOBIN: 17.4 g/dL — AB (ref 13.0–17.0)
MCHC: 35.3 g/dL (ref 30.0–36.0)
MCV: 94.9 fl (ref 78.0–100.0)
Platelets: 213 10*3/uL (ref 150.0–400.0)
RBC: 5.2 Mil/uL (ref 4.22–5.81)
RDW: 12.1 % (ref 11.5–15.5)
WBC: 10.1 10*3/uL (ref 4.0–10.5)

## 2017-09-12 ENCOUNTER — Encounter: Payer: Self-pay | Admitting: Family Medicine

## 2017-09-12 LAB — TESTOSTERONE TOTAL,FREE,BIO, MALES
ALBUMIN MSPROF: 4.6 g/dL (ref 3.6–5.1)
SEX HORMONE BINDING: 36 nmol/L (ref 10–50)
Testosterone, Bioavailable: 67.8 ng/dL — ABNORMAL LOW (ref >=110.0)
Testosterone, Free: 32.3 pg/mL — ABNORMAL LOW (ref 46.0–224.0)
Testosterone: 277 ng/dL (ref 250–827)

## 2017-09-13 ENCOUNTER — Telehealth: Payer: Self-pay | Admitting: Family Medicine

## 2017-09-13 NOTE — Telephone Encounter (Signed)
Patient returning missed phone call. Please advise.  °

## 2017-09-13 NOTE — Telephone Encounter (Signed)
Called and spoke with patient who states he viewed his lab results on My Chart. He sent his questions regarding his results to Dr. Yong Channel via My Chart

## 2017-09-27 MED FILL — SILDENAFIL 20 MG TABLET: 20 | 30 days supply | Qty: 50 | Fill #0

## 2017-11-16 MED FILL — CLOMIPHENE CITRATE 50 MG TA: 50 | 30 days supply | Qty: 30 | Fill #0

## 2017-12-19 MED FILL — CLOMIPHENE CITRATE 50 MG TA: 50 | 30 days supply | Qty: 30 | Fill #1

## 2018-01-16 MED FILL — CLOMIPHENE CITRATE 50 MG TA: 50 | 30 days supply | Qty: 30 | Fill #2

## 2018-02-20 MED FILL — SILDENAFIL 20 MG TABLET: 20 | 30 days supply | Qty: 50 | Fill #1

## 2018-02-20 MED FILL — CLOMIPHENE CITRATE 50 MG TA: 50 | 30 days supply | Qty: 30 | Fill #3

## 2018-03-24 MED FILL — CLOMIPHENE CITRATE 50 MG TA: 50 | 30 days supply | Qty: 30 | Fill #4

## 2018-04-03 ENCOUNTER — Ambulatory Visit (INDEPENDENT_AMBULATORY_CARE_PROVIDER_SITE_OTHER): Payer: No Typology Code available for payment source | Admitting: Family Medicine

## 2018-04-03 ENCOUNTER — Encounter: Payer: Self-pay | Admitting: Family Medicine

## 2018-04-03 VITALS — BP 110/88 | HR 70 | Temp 98.7°F | Ht 71.0 in | Wt 199.4 lb

## 2018-04-03 DIAGNOSIS — D751 Secondary polycythemia: Secondary | ICD-10-CM

## 2018-04-03 DIAGNOSIS — E785 Hyperlipidemia, unspecified: Secondary | ICD-10-CM

## 2018-04-03 DIAGNOSIS — Z0000 Encounter for general adult medical examination without abnormal findings: Secondary | ICD-10-CM

## 2018-04-03 DIAGNOSIS — E291 Testicular hypofunction: Secondary | ICD-10-CM | POA: Diagnosis not present

## 2018-04-03 DIAGNOSIS — Z1283 Encounter for screening for malignant neoplasm of skin: Secondary | ICD-10-CM

## 2018-04-03 DIAGNOSIS — N5089 Other specified disorders of the male genital organs: Secondary | ICD-10-CM

## 2018-04-03 DIAGNOSIS — N509 Disorder of male genital organs, unspecified: Secondary | ICD-10-CM

## 2018-04-03 NOTE — Assessment & Plan Note (Signed)
Spermatocele- monitoring only for now

## 2018-04-03 NOTE — Assessment & Plan Note (Signed)
Patient saw urology in January- he is now on Clomid for low testosterone.  Testosterone replacement was advised against due to polycythemia.  He remains on medications to help with erectile dysfunction-currently taking clomid. Still with some low libido but not having to use ED meds.  He feels better overall.

## 2018-04-03 NOTE — Assessment & Plan Note (Signed)
Polycythemia- no obvious cause on history-we will update labs again today. He wondered about some dehydration from fasting. One x reading thought related to altitude in 2015 but later recurred - ? Hemoconcentration.
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
## 2018-04-03 NOTE — Patient Instructions (Addendum)
Schedule a lab visit at the check out desk within 2 weeks. Return for future fasting labs meaning nothing but water after midnight please. Ok to take your medications with water.   Please stay very well hydrated before this exam with polycythemia history to try to avoid false positives  We will call you within two weeks about your referral to dermatology (please make sure to enter or call [facility] about this as well) . If you do not hear within 3 weeks, give [place] a call.   May want to go ahead and schedule a physical for 1 year in case [facility] requires it

## 2018-04-07 ENCOUNTER — Other Ambulatory Visit (INDEPENDENT_AMBULATORY_CARE_PROVIDER_SITE_OTHER): Payer: No Typology Code available for payment source

## 2018-04-07 DIAGNOSIS — Z0000 Encounter for general adult medical examination without abnormal findings: Secondary | ICD-10-CM

## 2018-04-07 DIAGNOSIS — D751 Secondary polycythemia: Secondary | ICD-10-CM | POA: Diagnosis not present

## 2018-04-07 DIAGNOSIS — E785 Hyperlipidemia, unspecified: Secondary | ICD-10-CM | POA: Diagnosis not present

## 2018-04-07 LAB — LIPID PANEL
CHOLESTEROL: 148 mg/dL (ref 0–200)
HDL: 39.8 mg/dL (ref >39.00)
LDL Cholesterol: 87 mg/dL (ref 0–99)
NonHDL: 107.93
TRIGLYCERIDES: 103 mg/dL (ref 0.0–149.0)
Total CHOL/HDL Ratio: 4
VLDL: 20.6 mg/dL (ref 0.0–40.0)

## 2018-04-07 LAB — COMPREHENSIVE METABOLIC PANEL
ALBUMIN: 4.5 g/dL (ref 3.5–5.2)
ALT: 18 U/L (ref 0–53)
AST: 15 U/L (ref 0–37)
Alkaline Phosphatase: 55 U/L (ref 39–117)
BUN: 12 mg/dL (ref 6–23)
CHLORIDE: 104 meq/L (ref 96–112)
CO2: 27 mEq/L (ref 19–32)
Calcium: 9.4 mg/dL (ref 8.4–10.5)
Creatinine, Ser: 1.07 mg/dL (ref 0.40–1.50)
GFR: 81.73 mL/min (ref >60.00)
Glucose, Bld: 77 mg/dL (ref 70–99)
POTASSIUM: 3.7 meq/L (ref 3.5–5.1)
Sodium: 140 mEq/L (ref 135–145)
Total Bilirubin: 0.7 mg/dL (ref 0.2–1.2)
Total Protein: 6.8 g/dL (ref 6.0–8.3)

## 2018-04-07 LAB — CBC WITH DIFFERENTIAL/PLATELET
BASOS PCT: 0.6 % (ref 0.0–3.0)
Basophils Absolute: 0.1 10*3/uL (ref 0.0–0.1)
EOS PCT: 2.1 % (ref 0.0–5.0)
Eosinophils Absolute: 0.2 10*3/uL (ref 0.0–0.7)
HEMATOCRIT: 44.8 % (ref 39.0–52.0)
HEMOGLOBIN: 15.9 g/dL (ref 13.0–17.0)
LYMPHS PCT: 39.9 % (ref 12.0–46.0)
Lymphs Abs: 4.1 10*3/uL — ABNORMAL HIGH (ref 0.7–4.0)
MCHC: 35.4 g/dL (ref 30.0–36.0)
MCV: 94.4 fl (ref 78.0–100.0)
MONO ABS: 0.7 10*3/uL (ref 0.1–1.0)
Monocytes Relative: 7.2 % (ref 3.0–12.0)
Neutro Abs: 5.2 10*3/uL (ref 1.4–7.7)
Neutrophils Relative %: 50.2 % (ref 43.0–77.0)
Platelets: 182 10*3/uL (ref 150.0–400.0)
RBC: 4.75 Mil/uL (ref 4.22–5.81)
RDW: 12 % (ref 11.5–15.5)
WBC: 10.4 10*3/uL (ref 4.0–10.5)

## 2018-04-08 NOTE — Progress Notes (Signed)
Your CBC was largely normal (blood counts, infection fighting cells, platelets). Could be dealing with a slight virus considering infection fighting cells for viruses (lymphocytes) a hair high Your CMET was normal (kidney, liver, and electrolytes, blood sugar).  Your cholesterol has improved from last year- great news.

## 2018-04-09 ENCOUNTER — Encounter: Payer: Self-pay | Admitting: Family Medicine

## 2018-04-24 MED FILL — CLOMIPHENE CITRATE 50 MG TA: 50 | 30 days supply | Qty: 30 | Fill #5

## 2018-05-08 ENCOUNTER — Encounter: Payer: Self-pay | Admitting: Family Medicine

## 2018-05-25 MED FILL — CLOMIPHENE CITRATE 50 MG TA: 50 | 30 days supply | Qty: 30 | Fill #6

## 2018-06-29 MED FILL — CLOMIPHENE CITRATE 50 MG TA: 50 | 30 days supply | Qty: 30 | Fill #7

## 2018-06-30 MED FILL — DOXYCYCLINE HYCLATE 100 MG: 100 | 14 days supply | Qty: 28 | Fill #0

## 2018-07-12 MED FILL — DOXYCYCLINE HYCLATE 100 MG: 100 | 14 days supply | Qty: 28 | Fill #0

## 2018-07-31 MED FILL — CLOMIPHENE CITRATE 50 MG TA: 50 | 30 days supply | Qty: 30 | Fill #8

## 2018-09-03 ENCOUNTER — Encounter: Payer: Self-pay | Admitting: Family Medicine

## 2018-09-13 MED FILL — CLOMIPHENE CITRATE 50 MG TA: 50 | 30 days supply | Qty: 30 | Fill #9

## 2018-10-19 MED FILL — CLOMIPHENE CITRATE 50 MG TA: 50 | 30 days supply | Qty: 30 | Fill #10

## 2018-11-28 MED FILL — CLOMIPHENE CITRATE 50 MG TA: 50 | 30 days supply | Qty: 30 | Fill #0 | Status: TO

## 2018-12-08 ENCOUNTER — Encounter: Payer: Self-pay | Admitting: Family Medicine

## 2018-12-09 ENCOUNTER — Encounter: Payer: Self-pay | Admitting: Family Medicine

## 2018-12-11 NOTE — Telephone Encounter (Signed)
Okay for X-ray consult?

## 2019-01-16 MED FILL — CLOMIPHENE CITRATE 50 MG TA: 50 | 30 days supply | Qty: 30 | Fill #0

## 2019-01-30 ENCOUNTER — Encounter: Payer: Self-pay | Admitting: Family Medicine

## 2019-01-31 ENCOUNTER — Ambulatory Visit (INDEPENDENT_AMBULATORY_CARE_PROVIDER_SITE_OTHER): Payer: No Typology Code available for payment source | Admitting: Family Medicine

## 2019-01-31 ENCOUNTER — Encounter: Payer: Self-pay | Admitting: Family Medicine

## 2019-02-01 NOTE — Patient Instructions (Signed)
There are no preventive care reminders to display for this patient.  Depression screen [facility] 2/9 08/30/2017 06/29/2016 06/26/2015  Decreased Interest 0 0 0  Down, Depressed, Hopeless 0 0 0  PHQ - 2 Score 0 0 0

## 2019-02-01 NOTE — Progress Notes (Signed)
LOS- no charge. Patient cancelled visit

## 2019-02-14 ENCOUNTER — Emergency Department
Admission: EM | Admit: 2019-02-14 | Discharge: 2019-02-14 | Disposition: A | Discharge status: Home / Self Care | Payer: No Typology Code available for payment source | Source: Home / Self Care

## 2019-02-14 ENCOUNTER — Other Ambulatory Visit: Payer: Self-pay

## 2019-02-14 ENCOUNTER — Encounter: Payer: Self-pay | Admitting: Family Medicine

## 2019-02-14 DIAGNOSIS — K529 Noninfective gastroenteritis and colitis, unspecified: Secondary | ICD-10-CM | POA: Diagnosis not present

## 2019-02-14 NOTE — Discharge Instructions (Addendum)
I suspect this is a viral gastroenteritis that usually clears over 24 to 48 hours with clear liquids, crackers and simple foods (avoiding dairy and greasy food)

## 2019-02-14 NOTE — ED Triage Notes (Signed)
Pt c/o nausea, chills, and RT flank pain x 2100 yesterday. Denies fever, travel, vomiting, diarrhea or dysuria.
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
## 2019-02-18 ENCOUNTER — Encounter: Payer: Self-pay | Admitting: Family Medicine

## 2019-02-19 ENCOUNTER — Other Ambulatory Visit: Payer: Self-pay

## 2019-02-19 ENCOUNTER — Ambulatory Visit (HOSPITAL_COMMUNITY)
Admission: RE | Admit: 2019-02-19 | Discharge: 2019-02-19 | Disposition: A | Discharge status: Home / Self Care | Payer: No Typology Code available for payment source | Source: Ambulatory Visit | Attending: Gastroenterology | Admitting: Gastroenterology

## 2019-02-19 ENCOUNTER — Other Ambulatory Visit (HOSPITAL_COMMUNITY): Payer: Self-pay | Admitting: Gastroenterology

## 2019-02-19 ENCOUNTER — Other Ambulatory Visit: Payer: Self-pay | Admitting: Gastroenterology

## 2019-02-19 DIAGNOSIS — R1011 Right upper quadrant pain: Secondary | ICD-10-CM

## 2019-02-19 DIAGNOSIS — R1010 Upper abdominal pain, unspecified: Secondary | ICD-10-CM

## 2019-02-19 LAB — CBC AND DIFFERENTIAL
HCT: 46 (ref 41–53)
Hemoglobin: 15.9 (ref 13.5–17.5)
Platelets: 191 (ref 150–399)
WBC: 9.9

## 2019-02-19 LAB — BASIC METABOLIC PANEL WITH GFR
BUN: 15 (ref 4–21)
Creatinine: 1.1 (ref 0.6–1.3)
Glucose: 88
Potassium: 4.4 (ref 3.4–5.3)
Sodium: 142 (ref 137–147)

## 2019-02-19 LAB — HEPATIC FUNCTION PANEL
ALT: 15 (ref 10–40)
AST: 12 — AB (ref 14–40)
Alkaline Phosphatase: 60 (ref 25–125)
Bilirubin, Total: 0.3

## 2019-02-19 LAB — TSH: TSH: 3.68 (ref <=5.90)

## 2019-02-19 LAB — LIPID PANEL
Cholesterol: 139 (ref 0–200)
HDL: 29 — AB (ref 35–70)
LDL Cholesterol: 86
Triglycerides: 121 (ref 40–160)

## 2019-02-19 NOTE — Telephone Encounter (Signed)
Referral sent today

## 2019-02-20 ENCOUNTER — Other Ambulatory Visit (HOSPITAL_COMMUNITY): Payer: Self-pay | Admitting: Gastroenterology

## 2019-02-20 ENCOUNTER — Ambulatory Visit (HOSPITAL_COMMUNITY)
Admission: RE | Admit: 2019-02-20 | Discharge: 2019-02-20 | Disposition: A | Discharge status: Home / Self Care | Payer: No Typology Code available for payment source | Source: Ambulatory Visit | Attending: Gastroenterology | Admitting: Gastroenterology

## 2019-02-20 DIAGNOSIS — R9389 Abnormal findings on diagnostic imaging of other specified body structures: Secondary | ICD-10-CM | POA: Diagnosis not present

## 2019-02-20 MED ORDER — IOHEXOL 300 MG/ML  SOLN
100.0000 mL | Freq: Once | INTRAMUSCULAR | Status: AC | PRN
  Administered 2019-02-20: 100 mL via INTRAVENOUS

## 2019-02-20 MED ORDER — SODIUM CHLORIDE (PF) 0.9 % IJ SOLN
INTRAMUSCULAR | Status: AC
  Filled 2019-02-20: qty 50

## 2019-02-26 MED FILL — CLOMIPHENE CITRATE 50 MG TA: 50 | 30 days supply | Qty: 30 | Fill #1

## 2019-02-26 MED FILL — FAMOTIDINE 40 MG TABLET: 40 | 30 days supply | Qty: 60 | Fill #0

## 2019-03-08 ENCOUNTER — Other Ambulatory Visit: Payer: Self-pay | Admitting: Urology

## 2019-03-08 ENCOUNTER — Encounter (HOSPITAL_COMMUNITY): Payer: Self-pay | Admitting: General Practice

## 2019-03-09 ENCOUNTER — Other Ambulatory Visit: Payer: Self-pay

## 2019-03-09 ENCOUNTER — Other Ambulatory Visit (HOSPITAL_COMMUNITY)
Admission: RE | Admit: 2019-03-09 | Discharge: 2019-03-09 | Disposition: A | Discharge status: Home / Self Care | Payer: No Typology Code available for payment source | Source: Ambulatory Visit | Attending: Urology | Admitting: Urology

## 2019-03-09 DIAGNOSIS — Z1159 Encounter for screening for other viral diseases: Secondary | ICD-10-CM | POA: Diagnosis not present

## 2019-03-09 LAB — SARS CORONAVIRUS 2 BY RT PCR (HOSPITAL ORDER, PERFORMED IN ~~LOC~~ HOSPITAL LAB): SARS Coronavirus 2: NEGATIVE

## 2019-03-10 NOTE — H&P (Signed)
Office Visit Report     02/28/2019   --------------------------------------------------------------------------------   Douglas Prom. Douglas Gibbs  MRN: [id number]  PRIMARY CARE:  Douglas Mars. Douglas Spry, MD  DOB: Jun 24, 1981, 38 year old Male  REFERRING:  Douglas Mars. Douglas Spry, MD  SSN: [id number]  PROVIDER:  Kathie Rhodes, M.D.    LOCATION:  [facility] [phone]   --------------------------------------------------------------------------------   CC: I have a ureteral stone.  HPI: Douglas Gibbs is a 38 year-old male established patient who is here for a ureteral calculus.  The patient's stone was on his right side. He first noticed the symptoms 02/12/2019. This is his first kidney stone. There is not a history of calculus disease in the family. He is not currently having flank pain, back pain, groin pain, nausea, vomiting, fever or chills. He does not have a burning sensation when he urinates. He has not caught a stone in his urine strainer since his symptoms began.   He has never had surgical treatment for calculi in the past. This condition would be considered of mild to moderate severity with no modifying factors or associated signs or symptoms other than as noted above.   02/21/19: The patient is seen for a left ureteral stone that was initially identified after he was seen by Dr. Collene Mares on 02/19/19. At that time he reported RUQ/periumbilical pain that had begun 7 days previously. He did not have any other GI symptoms. A renal ultrasound was performed on 02/19/19 which revealed right hydronephrosis prompting a CT scan the following day which revealed right hydronephrosis secondary to a 7 mm stone at the L4 vertebral level on the right-hand side with Hounsfield units of 1000 and no right or left renal calculi.  He reports that he has been doing better now. He said he occasionally has some discomfort that he rates 2/10. He has not seen a stone pass he denies any fever or hematuria.    02/28/19: He had a single episode of flank pain the day I saw him last and since then has had no further pain whatsoever. He has been on Rapaflo. He has not seen his stone pass. No hematuria.     ALLERGIES: No Allergies    MEDICATIONS: Rapaflo 8 mg capsule 1 capsule PO Q PM Take with your evening meal.  Clomiphene Citrate 50 mg tablet 1 tablet PO Daily  Fish Oil 300 mg-1,000 mg capsule Oral  Flonase 50 mcg/actuation spray, suspension Nasal  Multiple Vitamins tablet Oral  Multivitamins tablet Oral  Sildenafil Citrate 20 mg tablet Oral  Viagra 100 mg tablet Oral  Zyrtec 10 mg tablet Oral     GU PSH: None     PSH Notes: Closed Treatment Of Mandibular Fracture, Nasal Septal Deviation Repair   NON-GU PSH: Treat Lower Jaw Fracture - 2015    GU PMH: Ureteral calculus (Acute), Right, We are going to proceed with medical expulsive therapy. All see him back in a week for a KUB. - 02/21/2019 Ureteral obstruction secondary to calculous (Acute), Right, Am going to start him on Rapaflo. - 02/21/2019 Primary hypogonadism, He does have hypogonadism but I am concerned about starting him on exogenous testosterone for multiple reasons. He did not respond to Clomid in the past but would like to consider trying that again and then having his levels rechecked on this medication to see if a longer course may result in improvement in his levels. - 11/16/2017 Spermatocele (multiple), Right, We discussed the fact that his right spermatocele is  of no clinical significance. His greatest concern was that it could be potentially adversely affecting his testosterone levels and I told him that that was not the case. We did discuss briefly that surgical therapy would be necessary if it became a significant enough bother for him in the future. - 11/16/2017 Other microscopic hematuria, Microscopic hematuria - 21-Jan-2014 Male ED, unspecified, Erectile dysfunction - 01-21-14 Renal calculus, Right, Renal calculus, right - 21-Jan-2014       PMH Notes: Microscopic hematuria: This was discovered in 7/15. A CT scan revealed no significant abnormality and cystoscopy was negative with a negative NMP 22.   Right renal calculus: A minute right renal calculus was identified on CT scan in 7/15.   Hypogonadism: He was diagnosed with a low serum testosterone in 9/17 with normal FSH, LH and prolactin levels. He was treated for 2 months with testosterone replacement which resulted in improvement in his libido and energy levels. He was referred to an endocrinologist who noted a distant history of polycythemia and therefore declined to maintain him on testosterone therapy. He apparently had mild polycythemia in 22-Jan-2004 when he was doing a lot of skiing. He subsequently has had normal H&H. Tried Clomid.   Epididymal cyst: He was diagnosed with multiple right epididymal cysts by ultrasound in 9/17 and was performed for a palpable scrotal lesion. The testicles were noted be normal, subclinical hydroceles were identified bilaterally and a small left epididymal cyst was noted as well.   Erectile dysfunction: This was initially managed with Viagra 100 mg. He was switched to Cialis 20  mg. He is now using sildenafil using a very low dose.   NON-GU PMH: Secondary polycythemia, We did discuss this finding and my concern regarding testosterone replacement. I have recommended he begin a daily aspirin and will monitor his H&H. - 11/16/2017 Encounter for general adult medical examination without abnormal findings, Encounter for preventive health examination - 01-21-2014 Skin Cancer, History, History of nonmelanoma skin cancer - 2014-01-21    FAMILY HISTORY: Death - Runs In Family Diabetes - Runs In Family hyperlipidemia - Runs In Family Myocardial Infarction - Runs In Family pancreatic cancer - Runs In Family   SOCIAL HISTORY: Marital Status: Single Current Smoking Status: Patient smokes. Smokes 1 pack per day.   Tobacco Use Assessment Completed: Used Tobacco in last  30 days? Drinks 3 drinks per day. Types of alcohol consumed: Beer.  Patient's occupation Research officer, political party.     Notes: Former smoker, Single, Caffeine use, Alcohol use   REVIEW OF SYSTEMS:    GU Review Male:   Patient denies frequent urination, hard to postpone urination, burning/ pain with urination, get up at night to urinate, leakage of urine, stream starts and stops, trouble starting your stream, have to strain to urinate , erection problems, and penile pain.  Gastrointestinal (Upper):   Patient denies nausea, vomiting, and indigestion/ heartburn.  Gastrointestinal (Lower):   Patient denies diarrhea and constipation.  Constitutional:   Patient denies fever, night sweats, weight loss, and fatigue.  Skin:   Patient denies skin rash/ lesion and itching.  Eyes:   Patient denies blurred vision and double vision.  Ears/ Nose/ Throat:   Patient denies sore throat and sinus problems.  Hematologic/Lymphatic:   Patient denies swollen glands and easy bruising.  Cardiovascular:   Patient denies leg swelling and chest pains.  Respiratory:   Patient denies cough and shortness of breath.  Endocrine:   Patient denies excessive thirst.  Musculoskeletal:   Patient denies back pain  and joint pain.  Neurological:   Patient denies headaches and dizziness.  Psychologic:   Patient denies depression and anxiety.   VITAL SIGNS:      02/28/2019 09:09 AM  Weight 205 lb / 92.99 kg  Height 72 in / 182.88 cm  BP 103/65 mmHg  Pulse 61 /min  Temperature 97.7 F / 36.5 C  BMI 27.8 kg/m   PAST DATA REVIEWED:  Source Of History:  Patient  Lab Test Review:   Hemoglobin and Hematocrit, Testosterone Total,Free,Bio  Records Review:   Previous Patient Records  X-Ray Review: KUB: Reviewed Films. Previous KUB images were reviewed and compared with today's study. C.T. Abdomen/Pelvis: Reviewed Films. I reviewed his previous CT scan comparing it with today's KUB.    02/21/19 08/11/18  General Chemistry  Testosterone, Total  449.7 ng/dL   Hormones  Testosterone, Total  477.5 ng/dL    02/21/19  CBC  Hemoglobin 16.4 g/dL  Hematocrit % 46.7 %  Hormones  % Free Testosterone 1.8 %  SHBG 40.3 nmol/L  Testosterone, Bioavailable 193 ng/dL  Testosterone, Free 82.4 pg/mL   PROCEDURES:         KUB - 94174  A single view of the abdomen is obtained.      Patient confirmed No Neulasta OnPro Device. Independent review of his KUB today reveals the stone in his right ureter remains located at the level of the L4 transverse process on the right-hand side. It appears that it is not progressed.         Urinalysis w/Scope Dipstick Dipstick Cont'd Micro  Color: Straw Bilirubin: Neg mg/dL WBC/hpf: 0 - 5/hpf  Appearance: Clear Ketones: Neg mg/dL RBC/hpf: 0 - 2/hpf  Specific Gravity: 1.010 Blood: 1+ ery/uL Bacteria: NS (Not Seen)  pH: 5.5 Protein: Neg mg/dL Cystals: NS (Not Seen)  Glucose: Neg mg/dL Urobilinogen: 0.2 mg/dL Casts: NS (Not Seen)    Nitrites: Neg Trichomonas: Not Present    Leukocyte Esterase: Neg leu/uL Mucous: Not Present      Epithelial Cells: NS (Not Seen)      Yeast: NS (Not Seen)      Sperm: Not Present    ASSESSMENT:      ICD-10 Details  1 GU:   Ureteral calculus - N20.1 Right, Stable - He will be scheduled for lithotripsy of his rim right mid ureteral stone.  2   Primary hypogonadism - E29.1 Stable - I went over his testosterone level and hemoglobin and they were normal. He will therefore remain on Clomid as it has been effective for him.              Notes:   We had discussed the options for management of his stone at his last visit. He said that if he needed to have something done, and the stone was visible for lithotripsy, that would be his preference. I told him that his stone is now visible and I believe he would be a good candidate based on the location of the stone and it is apparent density on KUB. He would like to proceed with lithotripsy.    PLAN:           Schedule Return  Visit/Planned Activity: Next Available Appointment - Schedule Surgery          Document Letter(s):  Created for Patient: Clinical Summary   Created for Edmonia James, MD    * Signed by Kathie Rhodes, M.D. on 02/28/19 at 9:27 AM (EDT)*     The information contained  in this medical record document is considered private and confidential patient information. This information can only be used for the medical diagnosis and/or medical services that are being provided by the patient's selected caregivers. This information can only be distributed outside of the patient's care if the patient agrees and signs waivers of authorization for this information to be sent to an outside source or route.

## 2019-03-12 ENCOUNTER — Encounter (HOSPITAL_COMMUNITY)
Admission: RE | Disposition: A | Discharge status: Home / Self Care | Payer: Self-pay | Source: Home / Self Care | Attending: Urology

## 2019-03-12 ENCOUNTER — Encounter: Payer: Self-pay | Admitting: Family Medicine

## 2019-03-12 ENCOUNTER — Ambulatory Visit (HOSPITAL_COMMUNITY): Payer: No Typology Code available for payment source

## 2019-03-12 ENCOUNTER — Encounter (HOSPITAL_COMMUNITY): Payer: Self-pay | Admitting: General Practice

## 2019-03-12 ENCOUNTER — Ambulatory Visit (HOSPITAL_COMMUNITY)
Admission: RE | Admit: 2019-03-12 | Discharge: 2019-03-12 | Disposition: A | Discharge status: Home / Self Care | Payer: No Typology Code available for payment source | Attending: Urology | Admitting: Urology

## 2019-03-12 DIAGNOSIS — Z8249 Family history of ischemic heart disease and other diseases of the circulatory system: Secondary | ICD-10-CM | POA: Insufficient documentation

## 2019-03-12 DIAGNOSIS — Z85828 Personal history of other malignant neoplasm of skin: Secondary | ICD-10-CM | POA: Diagnosis not present

## 2019-03-12 DIAGNOSIS — F17210 Nicotine dependence, cigarettes, uncomplicated: Secondary | ICD-10-CM | POA: Diagnosis not present

## 2019-03-12 DIAGNOSIS — N529 Male erectile dysfunction, unspecified: Secondary | ICD-10-CM | POA: Diagnosis not present

## 2019-03-12 DIAGNOSIS — Z79899 Other long term (current) drug therapy: Secondary | ICD-10-CM | POA: Insufficient documentation

## 2019-03-12 DIAGNOSIS — N132 Hydronephrosis with renal and ureteral calculous obstruction: Secondary | ICD-10-CM | POA: Insufficient documentation

## 2019-03-12 DIAGNOSIS — N201 Calculus of ureter: Secondary | ICD-10-CM

## 2019-03-12 HISTORY — DX: Personal history of urinary calculi: Z87.442

## 2019-03-12 HISTORY — PX: EXTRACORPOREAL SHOCK WAVE LITHOTRIPSY: SHX1557

## 2019-03-12 SURGERY — LITHOTRIPSY, ESWL
Anesthesia: LOCAL | Laterality: Right

## 2019-03-12 MED ORDER — DIAZEPAM 5 MG PO TABS
10.0000 mg | ORAL_TABLET | ORAL | Status: AC
  Administered 2019-03-12: 10 mg via ORAL
  Filled 2019-03-12: qty 2

## 2019-03-12 MED ORDER — OXYCODONE-ACETAMINOPHEN 5-325 MG PO TABS: 1.0000 | ORAL_TABLET | ORAL | 0 refills | Status: DC | PRN

## 2019-03-12 MED ORDER — CIPROFLOXACIN HCL 500 MG PO TABS
500.0000 mg | ORAL_TABLET | ORAL | Status: AC
  Administered 2019-03-12: 500 mg via ORAL
  Filled 2019-03-12: qty 1

## 2019-03-12 MED ORDER — SODIUM CHLORIDE 0.9 % IV SOLN
INTRAVENOUS | Status: DC
  Administered 2019-03-12: 08:00:00 via INTRAVENOUS

## 2019-03-12 MED ORDER — PROMETHAZINE HCL 12.5 MG PO TABS: 12.5000 mg | ORAL_TABLET | Freq: Four times a day (QID) | ORAL | 0 refills | Status: DC | PRN

## 2019-03-12 MED ORDER — DIPHENHYDRAMINE HCL 25 MG PO CAPS
25.0000 mg | ORAL_CAPSULE | ORAL | Status: AC
  Administered 2019-03-12: 25 mg via ORAL
  Filled 2019-03-12: qty 1

## 2019-03-12 NOTE — Op Note (Addendum)
Right proximal stone 7 mm  Right ESWL   Findings: pt tolerated well. Stone well localized and remained in the target. Stone faded but persistent. He may need a staged procedure.   Nurse called and [facility] pharmacy closed so I sent the percocet rx the second time to [facility]. PMP aware clear. Pt called --  I added a few promethazine.

## 2019-03-12 NOTE — Discharge Instructions (Signed)
Lithotripsy, Care After °This sheet gives you information about how to care for yourself after your procedure. Your health care provider may also give you more specific instructions. If you have problems or questions, contact your health care provider. °What can I expect after the procedure? °After the procedure, it is common to have: °· Some blood in your urine. This should only last for a few days. °· Soreness in your back, sides, or upper abdomen for a few days. °· Blotches or bruises on your back where the pressure wave entered the skin. °· Pain, discomfort, or nausea when pieces (fragments) of the kidney stone move through the tube that carries urine from the kidney to the bladder (ureter). Stone fragments may pass soon after the procedure, but they may continue to pass for up to 4-8 weeks. °? If you have severe pain or nausea, contact your health care provider. This may be caused by a large stone that was not broken up, and this may mean that you need more treatment. °· Some pain or discomfort during urination. °· Some pain or discomfort in the lower abdomen or (in men) at the base of the penis. °Follow these instructions at home: °Medicines °· Take over-the-counter and prescription medicines only as told by your health care provider. °· If you were prescribed an antibiotic medicine, take it as told by your health care provider. Do not stop taking the antibiotic even if you start to feel better. °· Do not drive for 24 hours if you were given a medicine to help you relax (sedative). °· Do not drive or use heavy machinery while taking prescription pain medicine. °Eating and drinking ° °  ° °· Drink enough water and fluids to keep your urine clear or pale yellow. This helps any remaining pieces of the stone to pass. It can also help prevent new stones from forming. °· Eat plenty of fresh fruits and vegetables. °· Follow instructions from your health care provider about eating and drinking restrictions. You may be  instructed: °? To reduce how much salt (sodium) you eat or drink. Check ingredients and nutrition facts on packaged foods and beverages. °? To reduce how much meat you eat. °· Eat the recommended amount of calcium for your age and gender. Ask your health care provider how much calcium you should have. °General instructions °· Get plenty of rest. °· Most people can resume normal activities 1-2 days after the procedure. Ask your health care provider what activities are safe for you. °· Your health care provider may direct you to lie in a certain position (postural drainage) and tap firmly (percuss) over your kidney area to help stone fragments pass. Follow instructions as told by your health care provider. °· If directed, strain all urine through the strainer that was provided by your health care provider. °? Keep all fragments for your health care provider to see. Any stones that are found may be sent to a medical lab for examination. The stone may be as small as a grain of salt. °· Keep all follow-up visits as told by your health care provider. This is important. °Contact a health care provider if: °· You have pain that is severe or does not get better with medicine. °· You have nausea that is severe or does not go away. °· You have blood in your urine longer than your health care provider told you to expect. °· You have more blood in your urine. °· You have pain during urination that does   not go away.  You urinate more frequently than usual and this does not go away.  You develop a rash or any other possible signs of an allergic reaction. Get help right away if:  You have severe pain in your back, sides, or upper abdomen.  You have severe pain while urinating.  Your urine is very dark red.  You have blood in your stool (feces).  You cannot pass any urine at all.  You feel a strong urge to urinate after emptying your bladder.  You have a fever or chills.  You develop shortness of breath,  difficulty breathing, or chest pain.  You have severe nausea that leads to persistent vomiting.  You faint. Summary  After this procedure, it is common to have some pain, discomfort, or nausea when pieces (fragments) of the kidney stone move through the tube that carries urine from the kidney to the bladder (ureter). If this pain or nausea is severe, however, you should contact your health care provider.  Most people can resume normal activities 1-2 days after the procedure. Ask your health care provider what activities are safe for you.  Drink enough water and fluids to keep your urine clear or pale yellow. This helps any remaining pieces of the stone to pass, and it can help prevent new stones from forming.  If directed, strain your urine and keep all fragments for your health care provider to see. Fragments or stones may be as small as a grain of salt.  Get help right away if you have severe pain in your back, sides, or upper abdomen or have severe pain while urinating. This information is not intended to replace advice given to you by your health care provider. Make sure you discuss any questions you have with your health care provider. Document Released: 10/31/2007 Document Revised: 03/22/2018 Document Reviewed: 09/01/2016 Elsevier Interactive Patient Education  2019 [place] Anesthesia, Adult, Care After This sheet gives you information about how to care for yourself after your procedure. Your health care provider may also give you more specific instructions. If you have problems or questions, contact your health care provider. What can I expect after the procedure? After the procedure, the following side effects are common:  Pain or discomfort at the IV site.  Nausea.  Vomiting.  Sore throat.  Trouble concentrating.  Feeling cold or chills.  Weak or tired.  Sleepiness and fatigue.  Soreness and body aches. These side effects can affect parts of the  body that were not involved in surgery. Follow these instructions at home:  For at least 24 hours after the procedure:  Have a responsible adult stay with you. It is important to have someone help care for you until you are awake and alert.  Rest as needed.  Do not: ? Participate in activities in which you could fall or become injured. ? Drive. ? Use heavy machinery. ? Drink alcohol. ? Take sleeping pills or medicines that cause drowsiness. ? Make important decisions or sign legal documents. ? Take care of children on your own. Eating and drinking  Follow any instructions from your health care provider about eating or drinking restrictions.  When you feel hungry, start by eating small amounts of foods that are soft and easy to digest (bland), such as toast. Gradually return to your regular diet.  Drink enough fluid to keep your urine pale yellow.  If you vomit, rehydrate by drinking water, juice, or clear broth. General instructions  If  you have sleep apnea, surgery and certain medicines can increase your risk for breathing problems. Follow instructions from your health care provider about wearing your sleep device: ? Anytime you are sleeping, including during daytime naps. ? While taking prescription pain medicines, sleeping medicines, or medicines that make you drowsy.  Return to your normal activities as told by your health care provider. Ask your health care provider what activities are safe for you.  Take over-the-counter and prescription medicines only as told by your health care provider.  If you smoke, do not smoke without supervision.  Keep all follow-up visits as told by your health care provider. This is important. Contact a health care provider if:  You have nausea or vomiting that does not get better with medicine.  You cannot eat or drink without vomiting.  You have pain that does not get better with medicine.  You are unable to pass urine.  You develop  a skin rash.  You have a fever.  You have redness around your IV site that gets worse. Get help right away if:  You have difficulty breathing.  You have chest pain.  You have blood in your urine or stool, or you vomit blood. Summary  After the procedure, it is common to have a sore throat or nausea. It is also common to feel tired.  Have a responsible adult stay with you for the first 24 hours after general anesthesia. It is important to have someone help care for you until you are awake and alert.  When you feel hungry, start by eating small amounts of foods that are soft and easy to digest (bland), such as toast. Gradually return to your regular diet.  Drink enough fluid to keep your urine pale yellow.  Return to your normal activities as told by your health care provider. Ask your health care provider what activities are safe for you. This information is not intended to replace advice given to you by your health care provider. Make sure you discuss any questions you have with your health care provider. Document Released: 01/17/2001 Document Revised: 05/27/2017 Document Reviewed: 05/27/2017 Elsevier Interactive Patient Education  2019 [employer].

## 2019-03-12 NOTE — Interval H&P Note (Signed)
History and Physical Interval Note:  03/12/2019 9:53 AM  [name]  has presented today for surgery, with the diagnosis of RIGHT URETERAL STONE.  The various methods of treatment have been discussed with the patient and family. After consideration of risks, benefits and other options for treatment, the patient has consented to  Procedure(s): EXTRACORPOREAL SHOCK WAVE LITHOTRIPSY (ESWL) (Right) as a surgical intervention.  The patient's history has been reviewed, patient examined, no change in status, stable for surgery. He hasn't passed the stone. Stone remains right prox ureter on KUB today.  I have reviewed the patient's chart and labs.  Questions were answered to the patient's satisfaction.  He elects to proceed. He asked about Covid precautions on the Litho unit and Ed and I went over those. 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 His test was neg 03/09/2019.    Douglas Gibbs

## 2019-03-13 ENCOUNTER — Encounter (HOSPITAL_COMMUNITY): Payer: Self-pay | Admitting: Urology

## 2019-04-05 ENCOUNTER — Encounter: Payer: No Typology Code available for payment source | Admitting: Family Medicine

## 2019-04-05 ENCOUNTER — Other Ambulatory Visit: Payer: Self-pay

## 2019-04-05 ENCOUNTER — Ambulatory Visit (INDEPENDENT_AMBULATORY_CARE_PROVIDER_SITE_OTHER): Payer: No Typology Code available for payment source | Admitting: Family Medicine

## 2019-04-05 ENCOUNTER — Encounter: Payer: Self-pay | Admitting: Family Medicine

## 2019-04-05 VITALS — BP 112/78 | HR 78 | Temp 98.4°F | Ht 72.0 in | Wt 199.0 lb

## 2019-04-05 DIAGNOSIS — E785 Hyperlipidemia, unspecified: Secondary | ICD-10-CM | POA: Diagnosis not present

## 2019-04-05 DIAGNOSIS — E291 Testicular hypofunction: Secondary | ICD-10-CM | POA: Diagnosis not present

## 2019-04-05 DIAGNOSIS — Z1211 Encounter for screening for malignant neoplasm of colon: Secondary | ICD-10-CM

## 2019-04-05 DIAGNOSIS — Z0000 Encounter for general adult medical examination without abnormal findings: Secondary | ICD-10-CM

## 2019-04-05 DIAGNOSIS — Z23 Encounter for immunization: Secondary | ICD-10-CM

## 2019-04-05 LAB — CBC
HCT: 40.2 % (ref 39.0–52.0)
Hemoglobin: 14.2 g/dL (ref 13.0–17.0)
MCHC: 35.3 g/dL (ref 30.0–36.0)
MCV: 95 fl (ref 78.0–100.0)
Platelets: 208 10*3/uL (ref 150.0–400.0)
RBC: 4.23 Mil/uL (ref 4.22–5.81)
RDW: 12.5 % (ref 11.5–15.5)
WBC: 8.7 10*3/uL (ref 4.0–10.5)

## 2019-04-05 LAB — COMPREHENSIVE METABOLIC PANEL
ALT: 13 U/L (ref 0–53)
AST: 13 U/L (ref 0–37)
Albumin: 4.5 g/dL (ref 3.5–5.2)
Alkaline Phosphatase: 51 U/L (ref 39–117)
BUN: 17 mg/dL (ref 6–23)
CO2: 28 mEq/L (ref 19–32)
Calcium: 9.3 mg/dL (ref 8.4–10.5)
Chloride: 106 mEq/L (ref 96–112)
Creatinine, Ser: 1.25 mg/dL (ref 0.40–1.50)
GFR: 63.94 mL/min (ref >60.00)
Glucose, Bld: 82 mg/dL (ref 70–99)
Potassium: 3.9 mEq/L (ref 3.5–5.1)
Sodium: 142 mEq/L (ref 135–145)
Total Bilirubin: 0.4 mg/dL (ref 0.2–1.2)
Total Protein: 6.4 g/dL (ref 6.0–8.3)

## 2019-04-05 LAB — LIPID PANEL
Cholesterol: 153 mg/dL (ref 0–200)
HDL: 31.2 mg/dL — ABNORMAL LOW (ref >39.00)
NonHDL: 121.4
Total CHOL/HDL Ratio: 5
Triglycerides: 203 mg/dL — ABNORMAL HIGH (ref 0.0–149.0)
VLDL: 40.6 mg/dL — ABNORMAL HIGH (ref 0.0–40.0)

## 2019-04-05 LAB — LDL CHOLESTEROL, DIRECT: Direct LDL: 94 mg/dL

## 2019-04-05 NOTE — Patient Instructions (Addendum)
Tdap today   Glad you are doing well all things considered- hoping you get that [place] trip in!   Please stop by lab before you go If you do not have mychart- we will call you about results within 5 business days of [place] receiving them.  If you have mychart- we will send your results within 3 business days of [place] receiving them.  If abnormal or we want to clarify a result, we will call or mychart you to make sure you receive the message.  If you have questions or concerns or don't hear within 5-7 days, please send [place] a message or call us.
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
## 2019-04-18 ENCOUNTER — Telehealth: Payer: Self-pay | Admitting: *Deleted

## 2019-04-18 MED FILL — CLOMIPHENE CITRATE 50 MG TA: 50 | 30 days supply | Qty: 30 | Fill #0

## 2019-04-18 NOTE — Telephone Encounter (Signed)
Patient has changed GI provider to Dr Collene Mares. Last appointment to date is 02/19/19.

## 2019-06-01 MED FILL — CLOMIPHENE CITRATE 50 MG TA: 50 | 30 days supply | Qty: 30 | Fill #1

## 2019-06-01 MED FILL — FAMOTIDINE 40 MG TABLET: 40 | 30 days supply | Qty: 60 | Fill #0

## 2019-06-19 ENCOUNTER — Encounter: Payer: Self-pay | Admitting: Family Medicine

## 2019-07-04 MED FILL — CLOMIPHENE CITRATE 50 MG TA: 50 | 30 days supply | Qty: 30 | Fill #2

## 2019-08-28 MED FILL — FAMOTIDINE 40 MG TABLET: 40 | 30 days supply | Qty: 60 | Fill #1

## 2019-09-03 MED FILL — CLOMIPHENE CITRATE 50 MG TA: 50 | 30 days supply | Qty: 30 | Fill #3

## 2019-11-08 IMAGING — CT CT ABDOMEN AND PELVIS WITH CONTRAST
2 of 4 series · 16 of 46 positions shown, 18 images · IV contrast (omnipaque)
Comparison: CT 05/08/2014

CLINICAL DATA: Eval: hydronephrosis Abnormal US Pt Fathuhullo RLQ pain
since 02/13/19 [DATE] ml omni 300^100mL OMNIPAQUE IOHEXOL 300 MG/ML
SOLNEval: hydronephrosis

EXAM:
CT ABDOMEN AND PELVIS WITH CONTRAST
TECHNIQUE: Multidetector CT imaging of the abdomen and pelvis was performed
using the standard protocol following bolus administration of
intravenous contrast.
CONTRAST:  100mL OMNIPAQUE IOHEXOL 300 MG/ML  SOLN

[Series 2: axial st · axial · 0.84mm/px · z∈[-590,-145]mm · 13 of 101 slices shown, 15 images]
[im 6/101  soft-tissue]
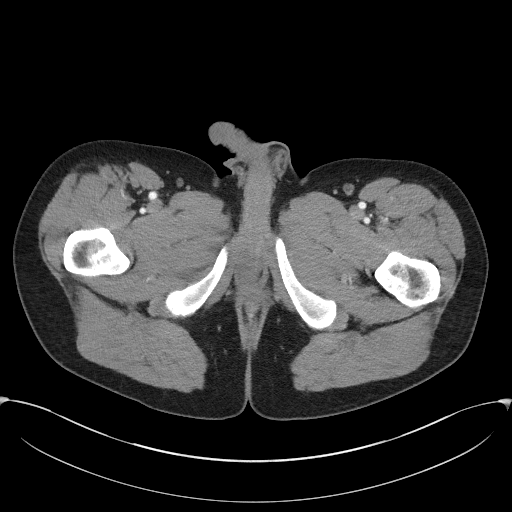
[im 6/101  bone]
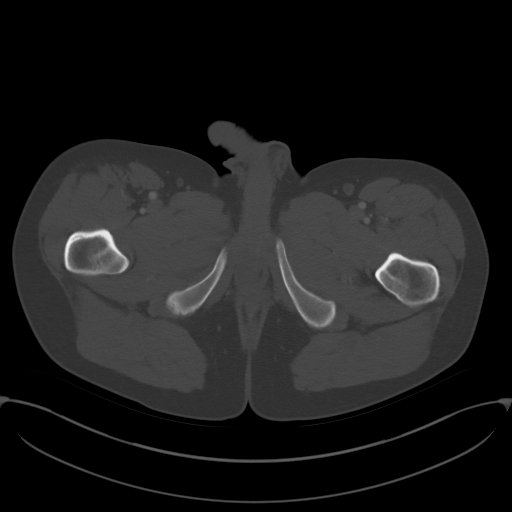
[im 12/101  soft-tissue]
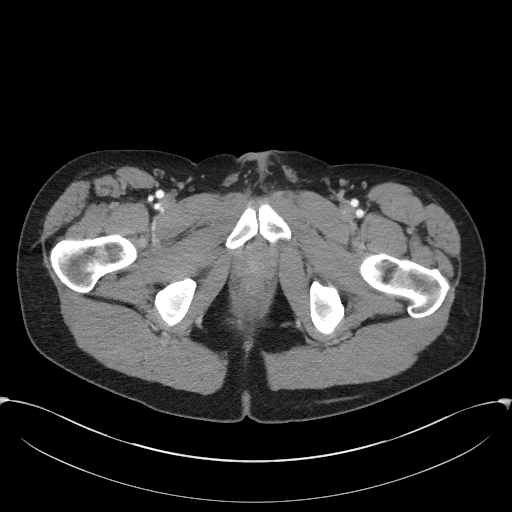
[im 23/101  soft-tissue]
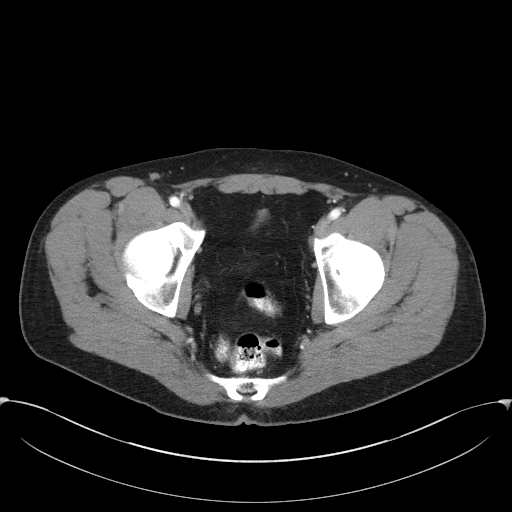
[im 28/101  soft-tissue]
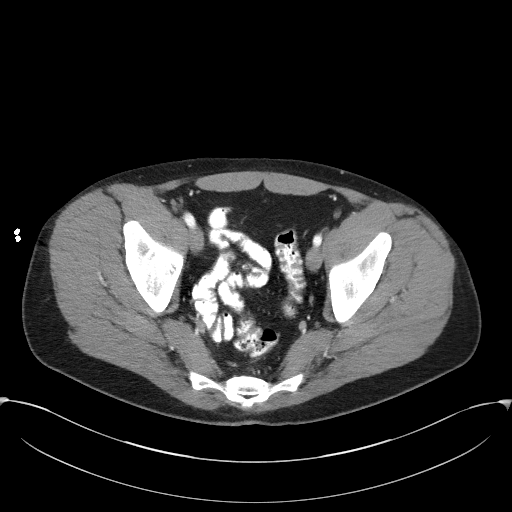
[im 34/101  soft-tissue]
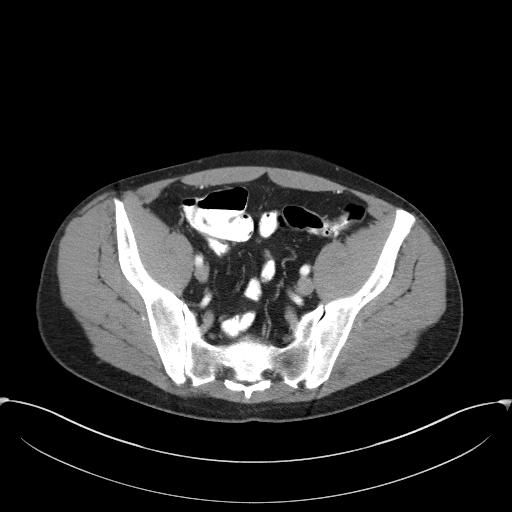
[im 45/101  soft-tissue]
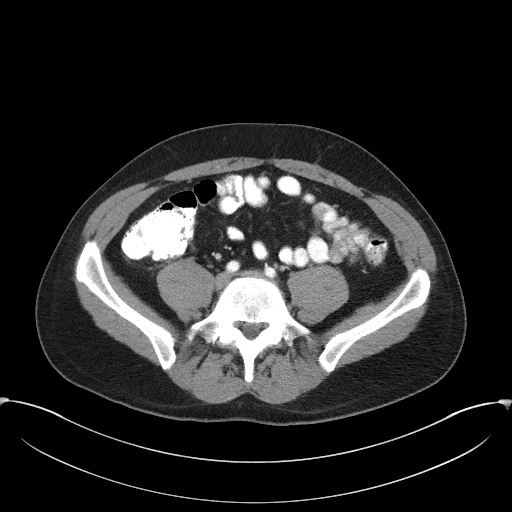
[im 51/101  soft-tissue]
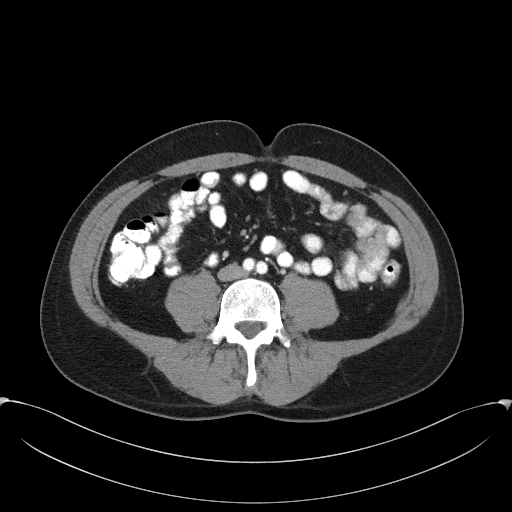
[im 56/101  soft-tissue]
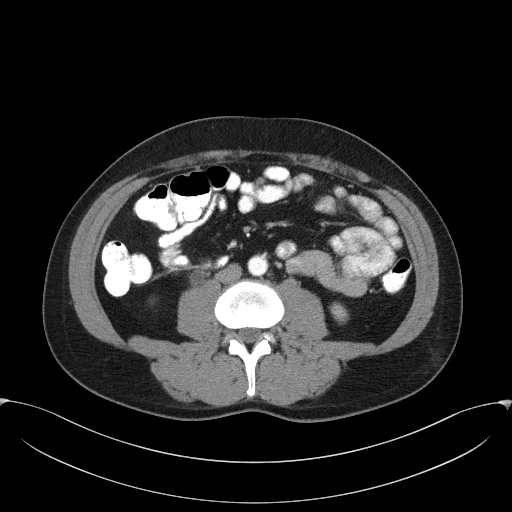
[im 67/101  soft-tissue]
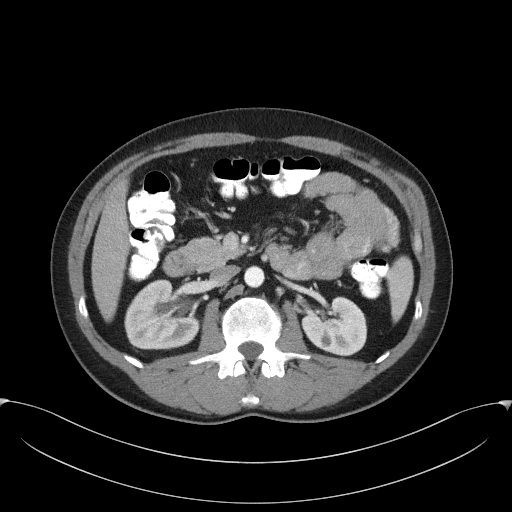
[im 67/101  bone]
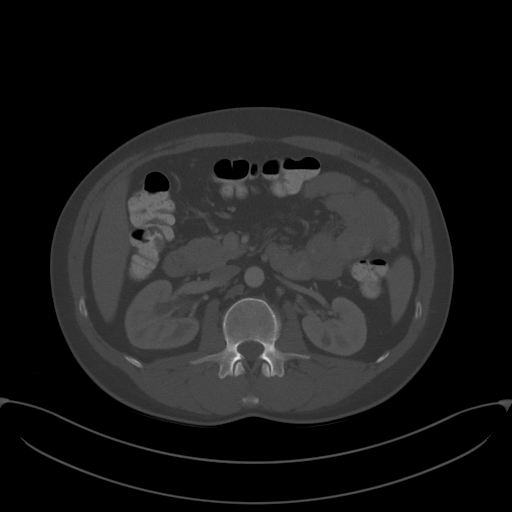
[im 73/101  soft-tissue]
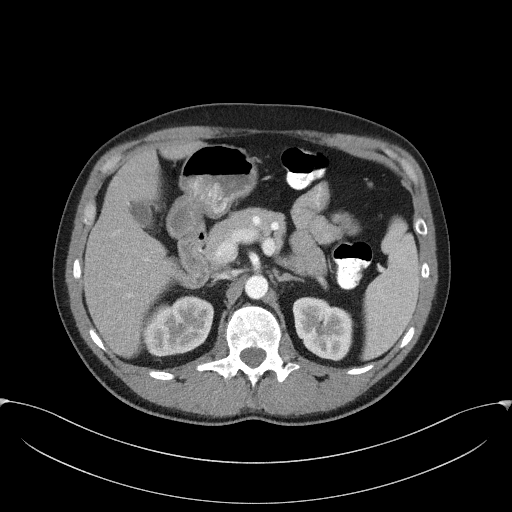
[im 78/101  soft-tissue]
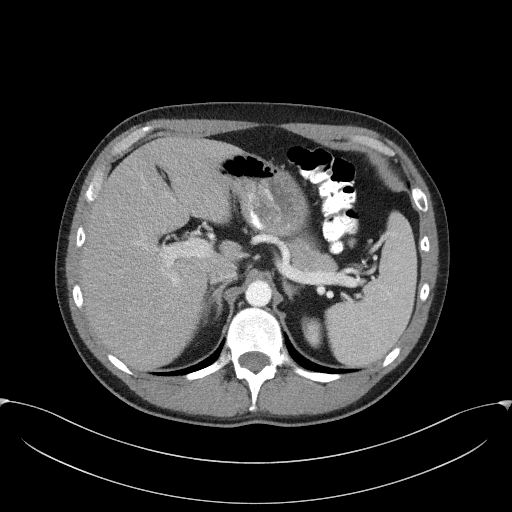
[im 89/101  soft-tissue]
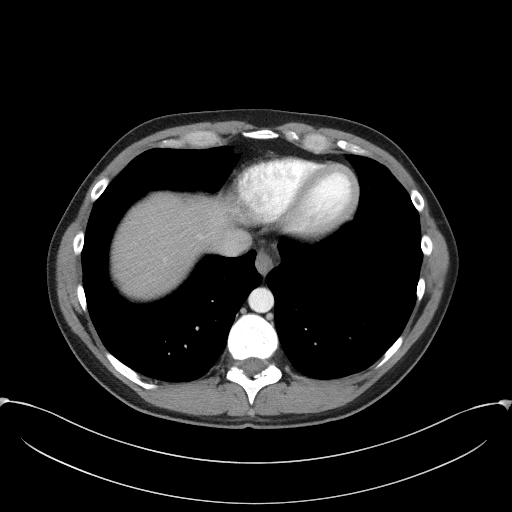
[im 95/101  soft-tissue]
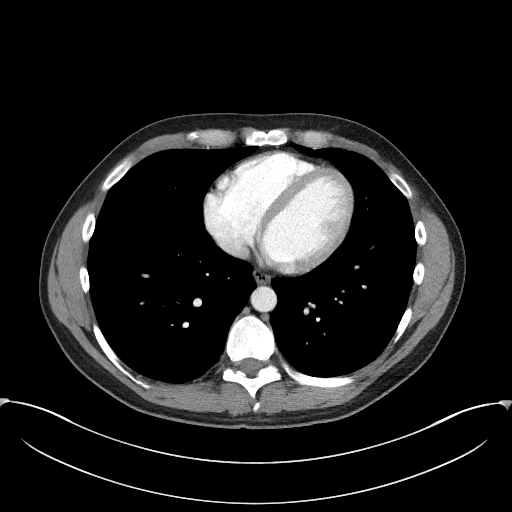

[Series 4: coronal st · coronal · 0.79mm/px · 3 of 97 slices shown]
[im 33/97  soft-tissue]
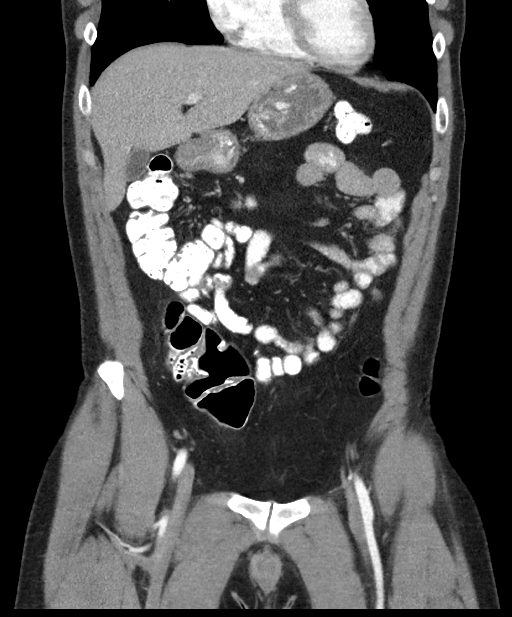
[im 43/97  soft-tissue]
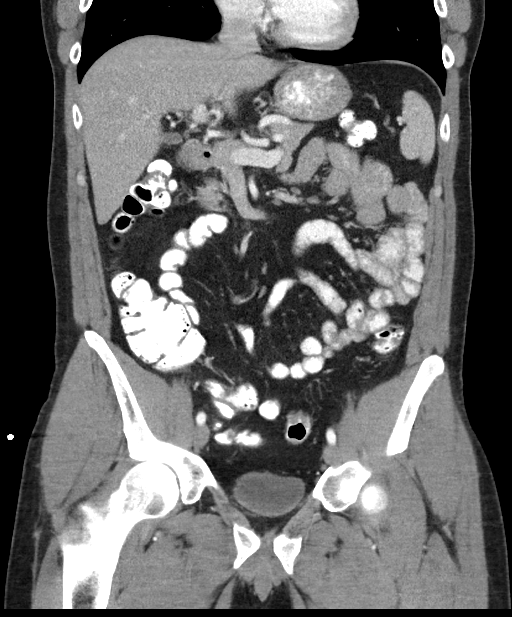
[im 54/97  soft-tissue]
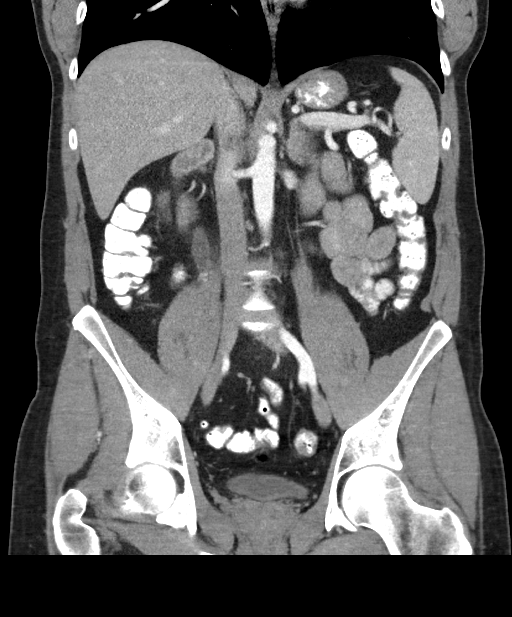

[16 of 46 positions shown; findings below may reference images not displayed]

FINDINGS: Lower chest: Lung bases are clear.

Hepatobiliary: No focal hepatic lesion. No biliary duct dilatation.
Gallbladder is normal. Common bile duct is normal.

Pancreas: Pancreas is normal. No ductal dilatation. No pancreatic
inflammation.

Spleen: Normal spleen

Adrenals/urinary tract: Adrenal glands normal. Mild hydronephrosis
of the RIGHT kidney. RIGHT proximal hydroureter. Obstructing
calculus in the mid RIGHT ureter measures 7 mm (image 49/2). RIGHT
ureteral calculus at the L4 vertebral body level. Calculus is
faintly evident on the CT topogram.

No nephrolithiasis.  No bladder calculi.

Stomach/Bowel: Stomach, small bowel, appendix, and cecum are normal.
The colon and rectosigmoid colon are normal.

Vascular/Lymphatic: Abdominal aorta is normal caliber. No periportal
or retroperitoneal adenopathy. No pelvic adenopathy.

Reproductive: Prostate normal

Other: No free fluid.

Musculoskeletal: No aggressive osseous lesion.
IMPRESSION: 1. Obstructing calculus in the mid RIGHT ureter with mild RIGHT
hydronephrosis.
2. Normal appendix.

These results will be called to the ordering clinician or
representative by the Radiologist Assistant, and communication
documented in the PACS or zVision Dashboard.

## 2019-11-27 MED FILL — CLOMIPHENE CITRATE 50 MG TA: 50 | 30 days supply | Qty: 30 | Fill #4

## 2019-11-27 MED FILL — FAMOTIDINE 40 MG TABLET: 40 | 30 days supply | Qty: 60 | Fill #2

## 2019-11-28 IMAGING — DX ABDOMEN - 1 VIEW
2 series · 2 of 2 positions shown · non-contrast
Comparison: Radiograph February 28, 2019.  CT scan February 20, 2019.

CLINICAL DATA: Right ureteral stone.

EXAM:
ABDOMEN - 1 VIEW

[abdomen kub (1 of 2)]
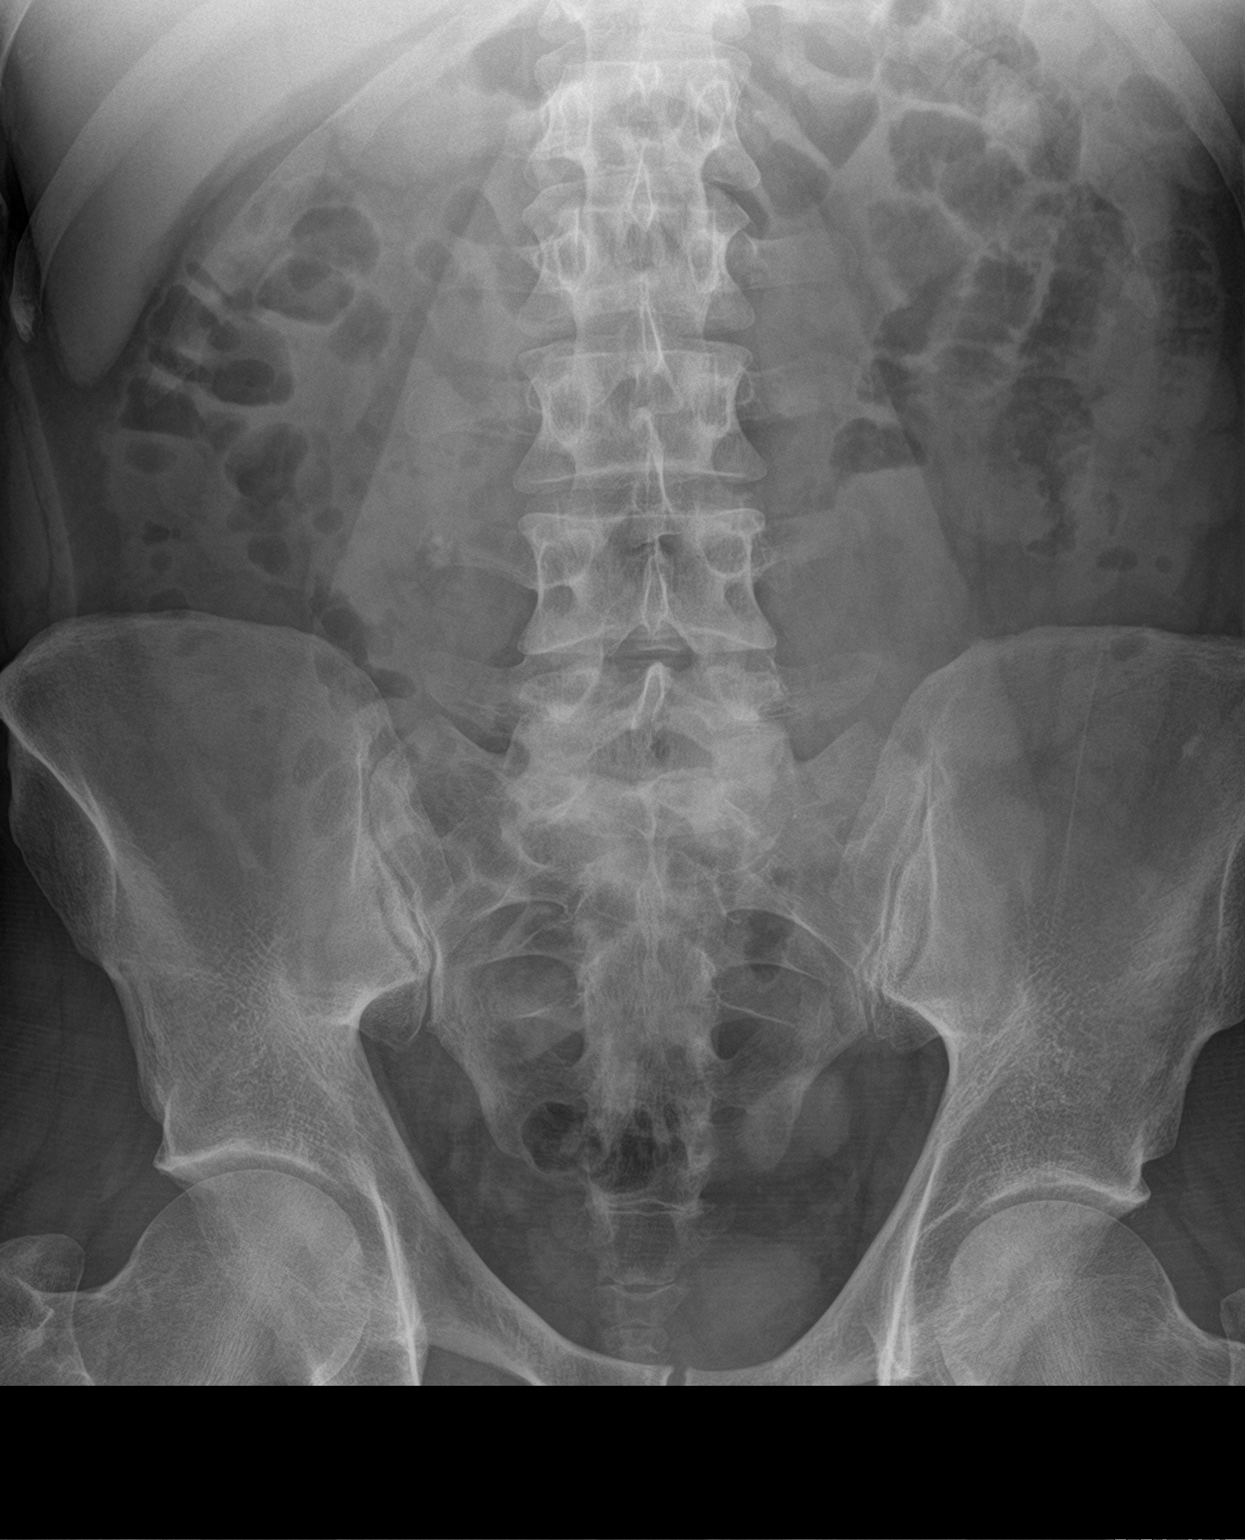

[abdomen kub (2 of 2)]
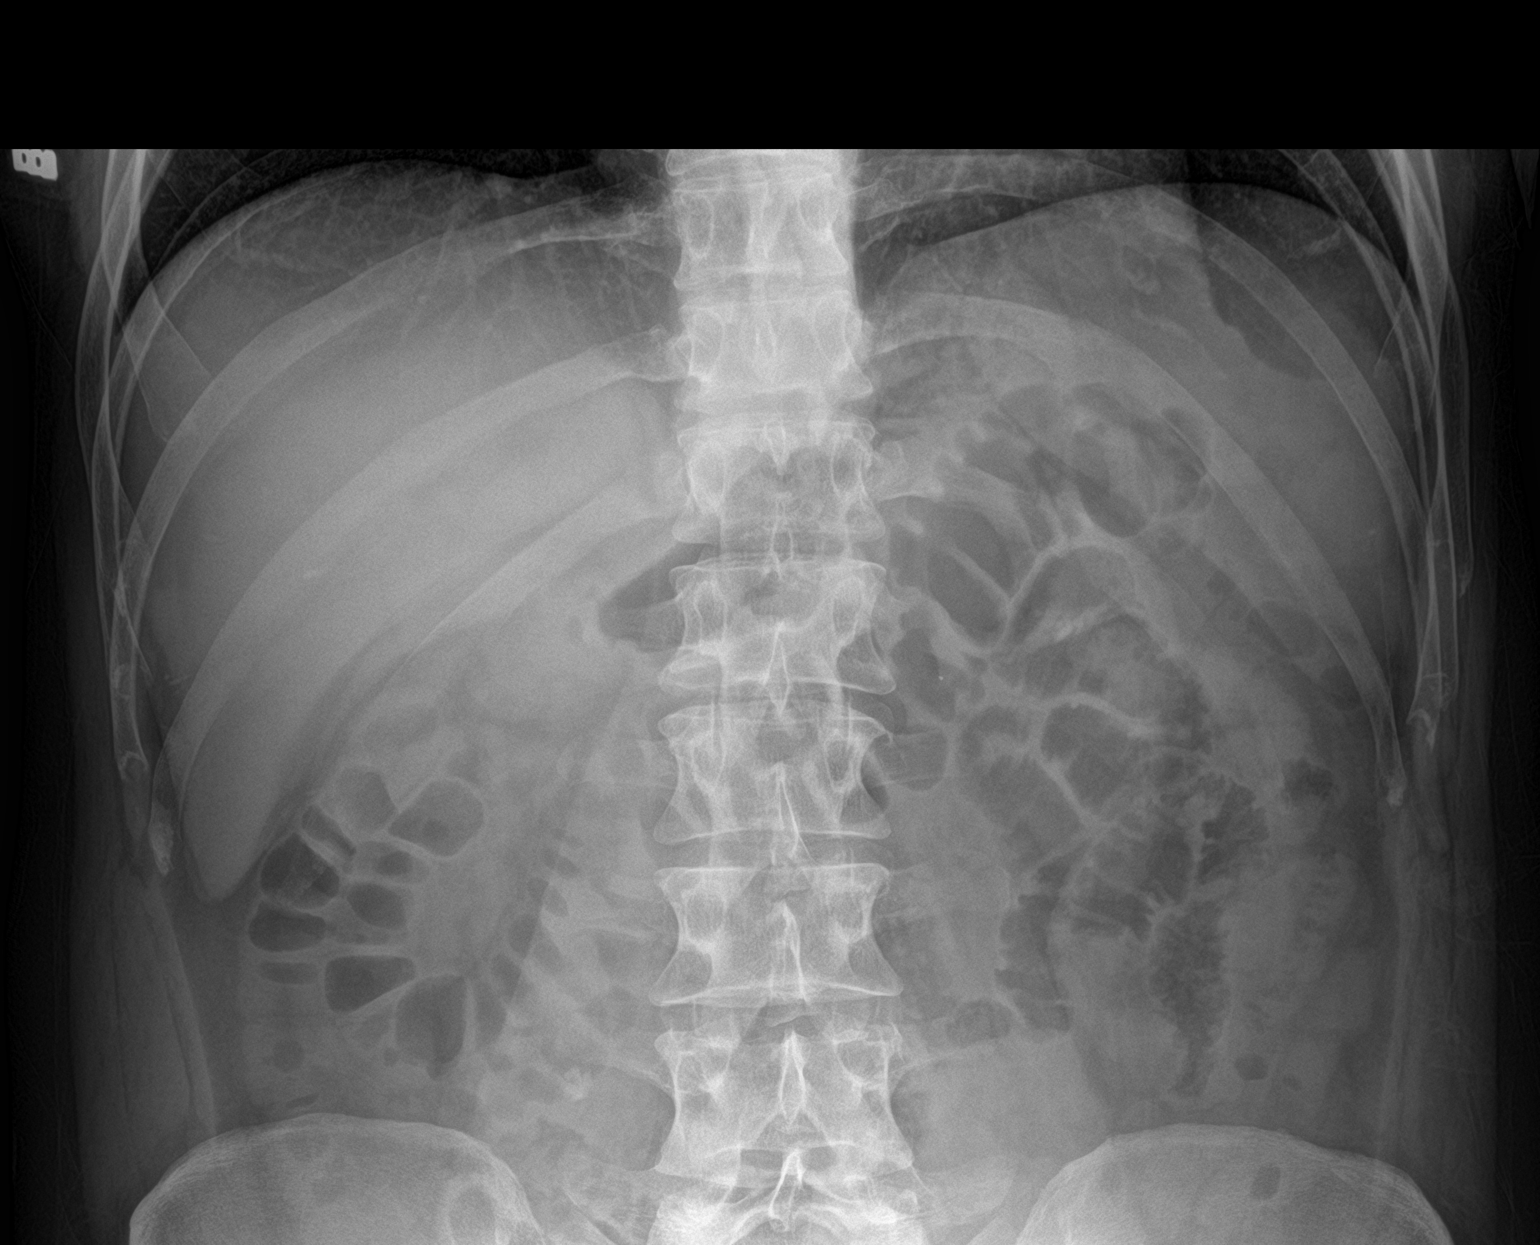

[2 of 2 positions shown; findings below may reference images not displayed]

FINDINGS: The bowel gas pattern is normal. Stable position of right ureteral
stone is noted.
IMPRESSION: Stable position of right ureteral stone.

## 2020-02-07 DIAGNOSIS — E291 Testicular hypofunction: Secondary | ICD-10-CM | POA: Diagnosis not present

## 2020-02-13 ENCOUNTER — Other Ambulatory Visit (HOSPITAL_COMMUNITY): Payer: Self-pay | Admitting: Urology

## 2020-02-13 DIAGNOSIS — N201 Calculus of ureter: Secondary | ICD-10-CM | POA: Diagnosis not present

## 2020-02-13 DIAGNOSIS — E291 Testicular hypofunction: Secondary | ICD-10-CM | POA: Diagnosis not present

## 2020-02-13 DIAGNOSIS — Z87442 Personal history of urinary calculi: Secondary | ICD-10-CM | POA: Diagnosis not present

## 2020-02-13 MED FILL — FAMOTIDINE 40 MG TABLET: 40 | 30 days supply | Qty: 60 | Fill #3

## 2020-02-13 MED FILL — CLOMIPHENE CITRATE 50 MG TA: 50 | 90 days supply | Qty: 90 | Fill #0

## 2020-02-21 DIAGNOSIS — Z85828 Personal history of other malignant neoplasm of skin: Secondary | ICD-10-CM | POA: Diagnosis not present

## 2020-02-21 DIAGNOSIS — D2261 Melanocytic nevi of right upper limb, including shoulder: Secondary | ICD-10-CM | POA: Diagnosis not present

## 2020-02-21 DIAGNOSIS — D485 Neoplasm of uncertain behavior of skin: Secondary | ICD-10-CM | POA: Diagnosis not present

## 2020-02-21 DIAGNOSIS — D225 Melanocytic nevi of trunk: Secondary | ICD-10-CM | POA: Diagnosis not present

## 2020-02-21 DIAGNOSIS — C44519 Basal cell carcinoma of skin of other part of trunk: Secondary | ICD-10-CM | POA: Diagnosis not present

## 2020-02-21 DIAGNOSIS — D2262 Melanocytic nevi of left upper limb, including shoulder: Secondary | ICD-10-CM | POA: Diagnosis not present

## 2020-02-21 DIAGNOSIS — D2271 Melanocytic nevi of right lower limb, including hip: Secondary | ICD-10-CM | POA: Diagnosis not present

## 2020-02-21 DIAGNOSIS — D2272 Melanocytic nevi of left lower limb, including hip: Secondary | ICD-10-CM | POA: Diagnosis not present

## 2020-03-04 ENCOUNTER — Encounter: Payer: Self-pay | Admitting: Family Medicine

## 2020-03-05 DIAGNOSIS — C44519 Basal cell carcinoma of skin of other part of trunk: Secondary | ICD-10-CM | POA: Diagnosis not present

## 2020-03-05 DIAGNOSIS — Z85828 Personal history of other malignant neoplasm of skin: Secondary | ICD-10-CM | POA: Diagnosis not present

## 2020-04-09 ENCOUNTER — Telehealth: Payer: Self-pay | Admitting: Family Medicine

## 2020-04-09 NOTE — Telephone Encounter (Signed)
Ok to schedule lab visit prior to CPE.

## 2020-04-09 NOTE — Telephone Encounter (Signed)
Patient has a CPE coming up.  Patient is requesting to have labs done prior to appt.  Please advise.

## 2020-04-14 ENCOUNTER — Encounter: Payer: Self-pay | Admitting: Family Medicine

## 2020-04-14 ENCOUNTER — Encounter: Payer: No Typology Code available for payment source | Admitting: Family Medicine

## 2020-04-17 NOTE — Telephone Encounter (Signed)
Done

## 2020-04-18 ENCOUNTER — Other Ambulatory Visit: Payer: Self-pay

## 2020-04-18 ENCOUNTER — Other Ambulatory Visit: Payer: 59

## 2020-04-18 DIAGNOSIS — E785 Hyperlipidemia, unspecified: Secondary | ICD-10-CM

## 2020-04-18 DIAGNOSIS — Z0000 Encounter for general adult medical examination without abnormal findings: Secondary | ICD-10-CM

## 2020-04-21 ENCOUNTER — Other Ambulatory Visit: Payer: Self-pay

## 2020-04-21 ENCOUNTER — Encounter: Payer: Self-pay | Admitting: Family Medicine

## 2020-04-21 ENCOUNTER — Ambulatory Visit (INDEPENDENT_AMBULATORY_CARE_PROVIDER_SITE_OTHER): Payer: 59 | Admitting: Family Medicine

## 2020-04-21 VITALS — BP 120/82 | HR 64 | Temp 98.3°F | Ht 72.0 in | Wt 210.8 lb

## 2020-04-21 DIAGNOSIS — Z0000 Encounter for general adult medical examination without abnormal findings: Secondary | ICD-10-CM

## 2020-04-21 DIAGNOSIS — E785 Hyperlipidemia, unspecified: Secondary | ICD-10-CM

## 2020-04-21 LAB — COMPREHENSIVE METABOLIC PANEL
ALT: 16 U/L (ref 0–53)
AST: 17 U/L (ref 0–37)
Albumin: 4.7 g/dL (ref 3.5–5.2)
Alkaline Phosphatase: 58 U/L (ref 39–117)
BUN: 14 mg/dL (ref 6–23)
CO2: 28 mEq/L (ref 19–32)
Calcium: 9.5 mg/dL (ref 8.4–10.5)
Chloride: 104 mEq/L (ref 96–112)
Creatinine, Ser: 1.17 mg/dL (ref 0.40–1.50)
GFR: 68.65 mL/min (ref >60.00)
Glucose, Bld: 87 mg/dL (ref 70–99)
Potassium: 3.7 mEq/L (ref 3.5–5.1)
Sodium: 139 mEq/L (ref 135–145)
Total Bilirubin: 0.5 mg/dL (ref 0.2–1.2)
Total Protein: 6.7 g/dL (ref 6.0–8.3)

## 2020-04-21 LAB — CBC WITH DIFFERENTIAL/PLATELET
Basophils Absolute: 0.1 10*3/uL (ref 0.0–0.1)
Basophils Relative: 0.8 % (ref 0.0–3.0)
Eosinophils Absolute: 0.2 10*3/uL (ref 0.0–0.7)
Eosinophils Relative: 1.9 % (ref 0.0–5.0)
HCT: 44.6 % (ref 39.0–52.0)
Hemoglobin: 15.5 g/dL (ref 13.0–17.0)
Lymphocytes Relative: 37.1 % (ref 12.0–46.0)
Lymphs Abs: 3.7 10*3/uL (ref 0.7–4.0)
MCHC: 34.7 g/dL (ref 30.0–36.0)
MCV: 97.7 fl (ref 78.0–100.0)
Monocytes Absolute: 0.8 10*3/uL (ref 0.1–1.0)
Monocytes Relative: 8.4 % (ref 3.0–12.0)
Neutro Abs: 5.1 10*3/uL (ref 1.4–7.7)
Neutrophils Relative %: 51.8 % (ref 43.0–77.0)
Platelets: 171 10*3/uL (ref 150.0–400.0)
RBC: 4.57 Mil/uL (ref 4.22–5.81)
RDW: 12.5 % (ref 11.5–15.5)
WBC: 9.9 10*3/uL (ref 4.0–10.5)

## 2020-04-21 LAB — LIPID PANEL
Cholesterol: 170 mg/dL (ref 0–200)
HDL: 36.7 mg/dL — ABNORMAL LOW (ref >39.00)
LDL Cholesterol: 114 mg/dL — ABNORMAL HIGH (ref 0–99)
NonHDL: 133.19
Total CHOL/HDL Ratio: 5
Triglycerides: 97 mg/dL (ref 0.0–149.0)
VLDL: 19.4 mg/dL (ref 0.0–40.0)

## 2020-04-21 MED ORDER — SILDENAFIL CITRATE 20 MG PO TABS: ORAL_TABLET | ORAL | 3 refills | Status: DC

## 2020-04-21 MED FILL — SILDENAFIL CITRATE 20 MG TA: 20 | 30 days supply | Qty: 50 | Fill #0

## 2020-04-21 NOTE — Patient Instructions (Addendum)
Please stop by lab before you go If you have mychart- we will send your results within 3 business days of [place] receiving them.  If you do not have mychart- we will call you about results within 5 business days of [place] receiving them.   I like your idea of working on getting back down to 180 in the long run. Perhaps at least target 195-200 by next year
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
## 2020-04-24 MED FILL — FAMOTIDINE 40 MG TABLET: 40 | 30 days supply | Qty: 60 | Fill #0

## 2020-05-29 MED FILL — CLOMIPHENE CITRATE 50 MG TA: 50 | 90 days supply | Qty: 90 | Fill #1

## 2020-09-08 MED FILL — CLOMIPHENE CITRATE 50 MG TA: 50 | 90 days supply | Qty: 90 | Fill #2

## 2020-12-13 MED FILL — CLOMIPHENE CITRATE 50 MG TA: 50 | 90 days supply | Qty: 90 | Fill #3

## 2021-03-12 DIAGNOSIS — E291 Testicular hypofunction: Secondary | ICD-10-CM | POA: Diagnosis not present

## 2021-03-12 DIAGNOSIS — Z125 Encounter for screening for malignant neoplasm of prostate: Secondary | ICD-10-CM | POA: Diagnosis not present

## 2021-03-12 LAB — PSA: PSA: 0.75

## 2021-03-19 ENCOUNTER — Other Ambulatory Visit (HOSPITAL_COMMUNITY): Payer: Self-pay

## 2021-03-19 DIAGNOSIS — N4342 Spermatocele of epididymis, multiple: Secondary | ICD-10-CM | POA: Diagnosis not present

## 2021-03-19 DIAGNOSIS — E291 Testicular hypofunction: Secondary | ICD-10-CM | POA: Diagnosis not present

## 2021-03-19 MED ORDER — CLOMIPHENE CITRATE 50 MG PO TABS
50.0000 mg | ORAL_TABLET | Freq: Every day | ORAL | 3 refills | Status: DC
  Filled 2021-03-19: qty 90, 90d supply, fill #0
  Filled 2021-06-08 (×2): qty 90, 90d supply, fill #1
  Filled 2021-10-08: qty 90, 90d supply, fill #2
  Filled 2021-12-25 – 2022-01-12 (×2): qty 90, 90d supply, fill #3

## 2021-03-20 ENCOUNTER — Other Ambulatory Visit (HOSPITAL_COMMUNITY): Payer: Self-pay

## 2021-03-25 ENCOUNTER — Other Ambulatory Visit (HOSPITAL_COMMUNITY): Payer: Self-pay
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
## 2021-04-22 ENCOUNTER — Encounter: Payer: Self-pay | Admitting: Family Medicine

## 2021-04-22 ENCOUNTER — Other Ambulatory Visit: Payer: Self-pay

## 2021-04-22 ENCOUNTER — Ambulatory Visit (INDEPENDENT_AMBULATORY_CARE_PROVIDER_SITE_OTHER): Payer: 59 | Admitting: Family Medicine

## 2021-04-22 VITALS — BP 130/86 | HR 78 | Temp 97.7°F | Ht 72.0 in | Wt 199.2 lb

## 2021-04-22 DIAGNOSIS — E785 Hyperlipidemia, unspecified: Secondary | ICD-10-CM

## 2021-04-22 DIAGNOSIS — Z0000 Encounter for general adult medical examination without abnormal findings: Secondary | ICD-10-CM

## 2021-04-22 LAB — LIPID PANEL
Cholesterol: 172 mg/dL (ref 0–200)
HDL: 42.4 mg/dL (ref >39.00)
LDL Cholesterol: 108 mg/dL — ABNORMAL HIGH (ref 0–99)
NonHDL: 130.02
Total CHOL/HDL Ratio: 4
Triglycerides: 108 mg/dL (ref 0.0–149.0)
VLDL: 21.6 mg/dL (ref 0.0–40.0)

## 2021-04-22 LAB — COMPREHENSIVE METABOLIC PANEL
ALT: 16 U/L (ref 0–53)
AST: 17 U/L (ref 0–37)
Albumin: 4.6 g/dL (ref 3.5–5.2)
Alkaline Phosphatase: 64 U/L (ref 39–117)
BUN: 19 mg/dL (ref 6–23)
CO2: 26 mEq/L (ref 19–32)
Calcium: 9.3 mg/dL (ref 8.4–10.5)
Chloride: 105 mEq/L (ref 96–112)
Creatinine, Ser: 1.24 mg/dL (ref 0.40–1.50)
GFR: 71.9 mL/min (ref >60.00)
Glucose, Bld: 89 mg/dL (ref 70–99)
Potassium: 3.8 mEq/L (ref 3.5–5.1)
Sodium: 139 mEq/L (ref 135–145)
Total Bilirubin: 0.5 mg/dL (ref 0.2–1.2)
Total Protein: 7 g/dL (ref 6.0–8.3)

## 2021-04-22 LAB — CBC WITH DIFFERENTIAL/PLATELET
Basophils Absolute: 0.1 10*3/uL (ref 0.0–0.1)
Basophils Relative: 0.6 % (ref 0.0–3.0)
Eosinophils Absolute: 0.2 10*3/uL (ref 0.0–0.7)
Eosinophils Relative: 2.2 % (ref 0.0–5.0)
HCT: 47.1 % (ref 39.0–52.0)
Hemoglobin: 16.8 g/dL (ref 13.0–17.0)
Lymphocytes Relative: 42.3 % (ref 12.0–46.0)
Lymphs Abs: 3.7 10*3/uL (ref 0.7–4.0)
MCHC: 35.6 g/dL (ref 30.0–36.0)
MCV: 94.6 fl (ref 78.0–100.0)
Monocytes Absolute: 0.7 10*3/uL (ref 0.1–1.0)
Monocytes Relative: 7.6 % (ref 3.0–12.0)
Neutro Abs: 4.1 10*3/uL (ref 1.4–7.7)
Neutrophils Relative %: 47.3 % (ref 43.0–77.0)
Platelets: 182 10*3/uL (ref 150.0–400.0)
RBC: 4.98 Mil/uL (ref 4.22–5.81)
RDW: 12.6 % (ref 11.5–15.5)
WBC: 8.7 10*3/uL (ref 4.0–10.5)

## 2021-04-22 NOTE — Patient Instructions (Addendum)
Please stop by lab before you go If you have mychart- we will send your results within 3 business days of [place] receiving them.  If you do not have mychart- we will call you about results within 5 business days of [place] receiving them.  *please also note that you will see labs on mychart as soon as they post. I will later go in and write notes on them- will say "notes from Dr. Yong Channel"  Recommended follow up: Return in about 1 year (around 04/22/2022) for physical or sooner if needed.

## 2021-05-29 DIAGNOSIS — E291 Testicular hypofunction: Secondary | ICD-10-CM | POA: Diagnosis not present

## 2021-05-29 DIAGNOSIS — N4342 Spermatocele of epididymis, multiple: Secondary | ICD-10-CM | POA: Diagnosis not present

## 2021-06-02 ENCOUNTER — Encounter: Payer: Self-pay | Admitting: Family Medicine

## 2021-06-08 ENCOUNTER — Other Ambulatory Visit: Payer: Self-pay

## 2021-06-08 ENCOUNTER — Other Ambulatory Visit (HOSPITAL_COMMUNITY): Payer: Self-pay

## 2021-06-09 ENCOUNTER — Other Ambulatory Visit (HOSPITAL_COMMUNITY): Payer: Self-pay

## 2021-06-11 ENCOUNTER — Other Ambulatory Visit (HOSPITAL_COMMUNITY): Payer: Self-pay

## 2021-06-15 DIAGNOSIS — D2272 Melanocytic nevi of left lower limb, including hip: Secondary | ICD-10-CM | POA: Diagnosis not present

## 2021-06-15 DIAGNOSIS — L080 Pyoderma: Secondary | ICD-10-CM | POA: Diagnosis not present

## 2021-06-15 DIAGNOSIS — L570 Actinic keratosis: Secondary | ICD-10-CM | POA: Diagnosis not present

## 2021-06-15 DIAGNOSIS — Z85828 Personal history of other malignant neoplasm of skin: Secondary | ICD-10-CM | POA: Diagnosis not present

## 2021-06-15 DIAGNOSIS — D225 Melanocytic nevi of trunk: Secondary | ICD-10-CM | POA: Diagnosis not present

## 2021-06-15 DIAGNOSIS — D2261 Melanocytic nevi of right upper limb, including shoulder: Secondary | ICD-10-CM | POA: Diagnosis not present

## 2021-06-15 DIAGNOSIS — D2271 Melanocytic nevi of right lower limb, including hip: Secondary | ICD-10-CM | POA: Diagnosis not present

## 2021-06-15 DIAGNOSIS — D2361 Other benign neoplasm of skin of right upper limb, including shoulder: Secondary | ICD-10-CM | POA: Diagnosis not present

## 2021-06-15 DIAGNOSIS — D2262 Melanocytic nevi of left upper limb, including shoulder: Secondary | ICD-10-CM | POA: Diagnosis not present

## 2021-06-15 DIAGNOSIS — D485 Neoplasm of uncertain behavior of skin: Secondary | ICD-10-CM | POA: Diagnosis not present

## 2021-07-07 ENCOUNTER — Other Ambulatory Visit (HOSPITAL_COMMUNITY): Payer: Self-pay

## 2021-07-07 DIAGNOSIS — Z85828 Personal history of other malignant neoplasm of skin: Secondary | ICD-10-CM | POA: Diagnosis not present

## 2021-07-07 DIAGNOSIS — L988 Other specified disorders of the skin and subcutaneous tissue: Secondary | ICD-10-CM | POA: Diagnosis not present

## 2021-07-07 DIAGNOSIS — D485 Neoplasm of uncertain behavior of skin: Secondary | ICD-10-CM | POA: Diagnosis not present

## 2021-07-07 MED ORDER — MUPIROCIN 2 % EX OINT
1.0000 "application " | TOPICAL_OINTMENT | Freq: Every day | CUTANEOUS | 0 refills | Status: DC
  Filled 2021-07-07: qty 22, 22d supply, fill #0

## 2021-10-08 ENCOUNTER — Other Ambulatory Visit (HOSPITAL_COMMUNITY): Payer: Self-pay

## 2021-10-09 ENCOUNTER — Other Ambulatory Visit (HOSPITAL_COMMUNITY): Payer: Self-pay

## 2021-12-25 ENCOUNTER — Other Ambulatory Visit (HOSPITAL_COMMUNITY): Payer: Self-pay

## 2022-01-04 ENCOUNTER — Other Ambulatory Visit (HOSPITAL_COMMUNITY): Payer: Self-pay

## 2022-01-12 ENCOUNTER — Other Ambulatory Visit (HOSPITAL_COMMUNITY): Payer: Self-pay

## 2022-02-22 DIAGNOSIS — E291 Testicular hypofunction: Secondary | ICD-10-CM | POA: Diagnosis not present

## 2022-03-01 ENCOUNTER — Other Ambulatory Visit (HOSPITAL_COMMUNITY): Payer: Self-pay

## 2022-03-01 DIAGNOSIS — E291 Testicular hypofunction: Secondary | ICD-10-CM | POA: Diagnosis not present

## 2022-03-01 MED ORDER — CLOMIPHENE CITRATE 50 MG PO TABS
50.0000 mg | ORAL_TABLET | Freq: Every day | ORAL | 3 refills | Status: DC
  Filled 2022-03-01 – 2022-04-21 (×2): qty 90, 90d supply, fill #0
  Filled 2022-07-15 – 2022-08-01 (×2): qty 90, 90d supply, fill #1
  Filled 2022-10-13: qty 90, 90d supply, fill #2
  Filled 2022-11-19: qty 30, 30d supply, fill #2

## 2022-04-21 ENCOUNTER — Other Ambulatory Visit (HOSPITAL_COMMUNITY): Payer: Self-pay

## 2022-05-27 DIAGNOSIS — K219 Gastro-esophageal reflux disease without esophagitis: Secondary | ICD-10-CM | POA: Diagnosis not present

## 2022-05-27 DIAGNOSIS — Z1211 Encounter for screening for malignant neoplasm of colon: Secondary | ICD-10-CM | POA: Diagnosis not present

## 2022-05-27 DIAGNOSIS — Z8371 Family history of colonic polyps: Secondary | ICD-10-CM | POA: Diagnosis not present

## 2022-05-27 DIAGNOSIS — Z800 Family history of malignant neoplasm of digestive organs: Secondary | ICD-10-CM | POA: Diagnosis not present

## 2022-06-11 ENCOUNTER — Other Ambulatory Visit (HOSPITAL_COMMUNITY): Payer: Self-pay

## 2022-06-11 MED ORDER — CLENPIQ 10-3.5-12 MG-GM -GM/175ML PO SOLN
ORAL | 0 refills | Status: DC
  Filled 2022-06-11 – 2022-06-23 (×2): qty 350, 1d supply, fill #0

## 2022-06-22 ENCOUNTER — Other Ambulatory Visit (HOSPITAL_COMMUNITY): Payer: Self-pay

## 2022-06-23 ENCOUNTER — Other Ambulatory Visit (HOSPITAL_COMMUNITY): Payer: Self-pay

## 2022-06-24 ENCOUNTER — Encounter: Payer: Self-pay | Admitting: Family Medicine

## 2022-06-24 ENCOUNTER — Other Ambulatory Visit (HOSPITAL_COMMUNITY)
Admission: RE | Admit: 2022-06-24 | Discharge: 2022-06-24 | Disposition: A | Discharge status: Home / Self Care | Payer: 59 | Source: Ambulatory Visit | Attending: Family Medicine | Admitting: Family Medicine

## 2022-06-24 ENCOUNTER — Ambulatory Visit (INDEPENDENT_AMBULATORY_CARE_PROVIDER_SITE_OTHER): Payer: 59 | Admitting: Family Medicine

## 2022-06-24 VITALS — BP 124/82 | HR 70 | Temp 98.2°F | Ht 72.0 in | Wt 196.6 lb

## 2022-06-24 DIAGNOSIS — Z113 Encounter for screening for infections with a predominantly sexual mode of transmission: Secondary | ICD-10-CM | POA: Insufficient documentation

## 2022-06-24 DIAGNOSIS — Z0000 Encounter for general adult medical examination without abnormal findings: Secondary | ICD-10-CM

## 2022-06-24 DIAGNOSIS — Z114 Encounter for screening for human immunodeficiency virus [HIV]: Secondary | ICD-10-CM

## 2022-06-24 DIAGNOSIS — E785 Hyperlipidemia, unspecified: Secondary | ICD-10-CM

## 2022-06-24 DIAGNOSIS — Z118 Encounter for screening for other infectious and parasitic diseases: Secondary | ICD-10-CM

## 2022-06-24 LAB — LIPID PANEL
Cholesterol: 146 mg/dL (ref 0–200)
HDL: 37.8 mg/dL — ABNORMAL LOW (ref >39.00)
LDL Cholesterol: 80 mg/dL (ref 0–99)
NonHDL: 108.28
Total CHOL/HDL Ratio: 4
Triglycerides: 139 mg/dL (ref 0.0–149.0)
VLDL: 27.8 mg/dL (ref 0.0–40.0)

## 2022-06-24 LAB — COMPREHENSIVE METABOLIC PANEL
ALT: 16 U/L (ref 0–53)
AST: 16 U/L (ref 0–37)
Albumin: 4.4 g/dL (ref 3.5–5.2)
Alkaline Phosphatase: 62 U/L (ref 39–117)
BUN: 13 mg/dL (ref 6–23)
CO2: 29 mEq/L (ref 19–32)
Calcium: 9.2 mg/dL (ref 8.4–10.5)
Chloride: 105 mEq/L (ref 96–112)
Creatinine, Ser: 1.15 mg/dL (ref 0.40–1.50)
GFR: 78.05 mL/min (ref >60.00)
Glucose, Bld: 88 mg/dL (ref 70–99)
Potassium: 4.2 mEq/L (ref 3.5–5.1)
Sodium: 141 mEq/L (ref 135–145)
Total Bilirubin: 0.5 mg/dL (ref 0.2–1.2)
Total Protein: 7 g/dL (ref 6.0–8.3)

## 2022-06-24 LAB — CBC WITH DIFFERENTIAL/PLATELET
Basophils Absolute: 0.1 10*3/uL (ref 0.0–0.1)
Basophils Relative: 0.6 % (ref 0.0–3.0)
Eosinophils Absolute: 0.2 10*3/uL (ref 0.0–0.7)
Eosinophils Relative: 1.6 % (ref 0.0–5.0)
HCT: 46.9 % (ref 39.0–52.0)
Hemoglobin: 16.7 g/dL (ref 13.0–17.0)
Lymphocytes Relative: 35.5 % (ref 12.0–46.0)
Lymphs Abs: 3.4 10*3/uL (ref 0.7–4.0)
MCHC: 35.6 g/dL (ref 30.0–36.0)
MCV: 95.9 fl (ref 78.0–100.0)
Monocytes Absolute: 0.7 10*3/uL (ref 0.1–1.0)
Monocytes Relative: 7.1 % (ref 3.0–12.0)
Neutro Abs: 5.2 10*3/uL (ref 1.4–7.7)
Neutrophils Relative %: 55.2 % (ref 43.0–77.0)
Platelets: 181 10*3/uL (ref 150.0–400.0)
RBC: 4.89 Mil/uL (ref 4.22–5.81)
RDW: 12.4 % (ref 11.5–15.5)
WBC: 9.5 10*3/uL (ref 4.0–10.5)

## 2022-06-24 NOTE — Patient Instructions (Addendum)
Please stop by lab before you go If you have mychart- we will send your results within 3 business days of [place] receiving them.  If you do not have mychart- we will call you about results within 5 business days of [place] receiving them.  *please also note that you will see labs on mychart as soon as they post. I will later go in and write notes on them- will say "notes from Dr. Yong Channel"   Recommended follow up: Return in about 1 year (around 06/25/2023) for physical or sooner if needed.Schedule b4 you leave.
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
## 2022-06-25 DIAGNOSIS — Z800 Family history of malignant neoplasm of digestive organs: Secondary | ICD-10-CM | POA: Diagnosis not present

## 2022-06-25 DIAGNOSIS — K635 Polyp of colon: Secondary | ICD-10-CM | POA: Diagnosis not present

## 2022-06-25 DIAGNOSIS — D128 Benign neoplasm of rectum: Secondary | ICD-10-CM | POA: Diagnosis not present

## 2022-06-25 DIAGNOSIS — Z1211 Encounter for screening for malignant neoplasm of colon: Secondary | ICD-10-CM | POA: Diagnosis not present

## 2022-06-25 LAB — URINE CYTOLOGY ANCILLARY ONLY
Chlamydia: NEGATIVE
Comment: NEGATIVE
Comment: NEGATIVE
Comment: NORMAL
Neisseria Gonorrhea: NEGATIVE
Trichomonas: NEGATIVE

## 2022-06-25 LAB — HM COLONOSCOPY

## 2022-06-25 LAB — RPR: RPR Ser Ql: NONREACTIVE

## 2022-06-25 LAB — HIV ANTIBODY (ROUTINE TESTING W REFLEX): HIV 1&2 Ab, 4th Generation: NONREACTIVE

## 2022-06-26 ENCOUNTER — Encounter: Payer: Self-pay | Admitting: Family Medicine

## 2022-06-30 ENCOUNTER — Other Ambulatory Visit (HOSPITAL_COMMUNITY): Payer: Self-pay

## 2022-06-30 DIAGNOSIS — L814 Other melanin hyperpigmentation: Secondary | ICD-10-CM | POA: Diagnosis not present

## 2022-06-30 DIAGNOSIS — D2271 Melanocytic nevi of right lower limb, including hip: Secondary | ICD-10-CM | POA: Diagnosis not present

## 2022-06-30 DIAGNOSIS — D2262 Melanocytic nevi of left upper limb, including shoulder: Secondary | ICD-10-CM | POA: Diagnosis not present

## 2022-06-30 DIAGNOSIS — D2261 Melanocytic nevi of right upper limb, including shoulder: Secondary | ICD-10-CM | POA: Diagnosis not present

## 2022-06-30 DIAGNOSIS — D2361 Other benign neoplasm of skin of right upper limb, including shoulder: Secondary | ICD-10-CM | POA: Diagnosis not present

## 2022-06-30 DIAGNOSIS — D225 Melanocytic nevi of trunk: Secondary | ICD-10-CM | POA: Diagnosis not present

## 2022-06-30 DIAGNOSIS — Z85828 Personal history of other malignant neoplasm of skin: Secondary | ICD-10-CM | POA: Diagnosis not present

## 2022-06-30 DIAGNOSIS — D1801 Hemangioma of skin and subcutaneous tissue: Secondary | ICD-10-CM | POA: Diagnosis not present

## 2022-06-30 MED ORDER — AZITHROMYCIN 250 MG PO TABS
ORAL_TABLET | ORAL | 0 refills | Status: AC
  Filled 2022-06-30: qty 6, 5d supply, fill #0

## 2022-07-01 ENCOUNTER — Other Ambulatory Visit: Payer: Self-pay | Admitting: Family Medicine

## 2022-07-02 ENCOUNTER — Other Ambulatory Visit (HOSPITAL_COMMUNITY): Payer: Self-pay

## 2022-07-02 MED ORDER — FAMOTIDINE 40 MG PO TABS
40.0000 mg | ORAL_TABLET | ORAL | 3 refills | Status: DC
  Filled 2022-07-02: qty 60, 40d supply, fill #0
  Filled 2022-09-08: qty 60, 40d supply, fill #1
  Filled 2022-10-13: qty 60, 40d supply, fill #2
  Filled 2022-11-19: qty 45, 30d supply, fill #2

## 2022-07-15 ENCOUNTER — Other Ambulatory Visit (HOSPITAL_COMMUNITY): Payer: Self-pay

## 2022-07-27 ENCOUNTER — Other Ambulatory Visit (HOSPITAL_COMMUNITY): Payer: Self-pay

## 2022-08-02 ENCOUNTER — Other Ambulatory Visit (HOSPITAL_COMMUNITY): Payer: Self-pay

## 2022-09-08 ENCOUNTER — Other Ambulatory Visit (HOSPITAL_COMMUNITY): Payer: Self-pay

## 2022-09-08 ENCOUNTER — Encounter: Payer: Self-pay | Admitting: Family Medicine

## 2022-09-08 ENCOUNTER — Other Ambulatory Visit: Payer: Self-pay

## 2022-09-08 MED ORDER — SILDENAFIL CITRATE 20 MG PO TABS
40.0000 mg | ORAL_TABLET | ORAL | 3 refills | Status: DC | PRN
  Filled 2022-09-08: qty 50, 30d supply, fill #0
  Filled 2022-10-13 – 2022-11-19 (×2): qty 50, 30d supply, fill #1

## 2022-09-09 ENCOUNTER — Other Ambulatory Visit (HOSPITAL_COMMUNITY): Payer: Self-pay

## 2022-10-04 ENCOUNTER — Encounter: Payer: Self-pay | Admitting: Family Medicine

## 2022-10-13 ENCOUNTER — Other Ambulatory Visit (HOSPITAL_COMMUNITY): Payer: Self-pay

## 2022-10-13 ENCOUNTER — Other Ambulatory Visit: Payer: Self-pay | Admitting: Family Medicine

## 2022-10-13 MED ORDER — VITAMIN D 25 MCG (1000 UNIT) PO TABS
1000.0000 [IU] | ORAL_TABLET | Freq: Every day | ORAL | 3 refills | Status: AC
  Filled 2022-10-13 – 2022-11-19 (×4): qty 90, 90d supply, fill #0

## 2022-10-14 ENCOUNTER — Encounter (HOSPITAL_COMMUNITY): Payer: Self-pay

## 2022-10-14 ENCOUNTER — Other Ambulatory Visit (HOSPITAL_COMMUNITY): Payer: Self-pay

## 2022-10-15 ENCOUNTER — Other Ambulatory Visit (HOSPITAL_COMMUNITY): Payer: Self-pay

## 2022-10-21 ENCOUNTER — Other Ambulatory Visit: Payer: Self-pay

## 2022-11-02 ENCOUNTER — Other Ambulatory Visit (HOSPITAL_COMMUNITY): Payer: Self-pay

## 2022-11-19 ENCOUNTER — Other Ambulatory Visit (HOSPITAL_COMMUNITY): Payer: Self-pay

## 2022-11-19 ENCOUNTER — Encounter (HOSPITAL_COMMUNITY): Payer: Self-pay

## 2022-12-13 ENCOUNTER — Other Ambulatory Visit (HOSPITAL_COMMUNITY): Payer: Self-pay

## 2023-05-18 ENCOUNTER — Other Ambulatory Visit: Payer: Self-pay | Admitting: Family Medicine

## 2023-06-09 ENCOUNTER — Encounter (INDEPENDENT_AMBULATORY_CARE_PROVIDER_SITE_OTHER): Payer: Self-pay

## 2023-06-29 ENCOUNTER — Encounter: Payer: Self-pay | Admitting: Family Medicine

## 2023-06-29 ENCOUNTER — Ambulatory Visit (INDEPENDENT_AMBULATORY_CARE_PROVIDER_SITE_OTHER): Payer: BC Managed Care – PPO | Admitting: Family Medicine

## 2023-06-29 VITALS — BP 138/80 | HR 97 | Temp 97.6°F | Ht 72.0 in | Wt 204.0 lb

## 2023-06-29 DIAGNOSIS — E785 Hyperlipidemia, unspecified: Secondary | ICD-10-CM

## 2023-06-29 DIAGNOSIS — Z0000 Encounter for general adult medical examination without abnormal findings: Secondary | ICD-10-CM | POA: Diagnosis not present

## 2023-06-29 DIAGNOSIS — Z23 Encounter for immunization: Secondary | ICD-10-CM | POA: Diagnosis not present

## 2023-06-29 LAB — COMPREHENSIVE METABOLIC PANEL
ALT: 15 U/L (ref 0–53)
AST: 19 U/L (ref 0–37)
Albumin: 4.1 g/dL (ref 3.5–5.2)
Alkaline Phosphatase: 53 U/L (ref 39–117)
BUN: 14 mg/dL (ref 6–23)
CO2: 26 meq/L (ref 19–32)
Calcium: 9 mg/dL (ref 8.4–10.5)
Chloride: 109 meq/L (ref 96–112)
Creatinine, Ser: 1.11 mg/dL (ref 0.40–1.50)
GFR: 80.86 mL/min (ref >60.00)
Glucose, Bld: 85 mg/dL (ref 70–99)
Potassium: 3.5 meq/L (ref 3.5–5.1)
Sodium: 142 meq/L (ref 135–145)
Total Bilirubin: 0.6 mg/dL (ref 0.2–1.2)
Total Protein: 6.6 g/dL (ref 6.0–8.3)

## 2023-06-29 LAB — CBC WITH DIFFERENTIAL/PLATELET
Basophils Absolute: 0 10*3/uL (ref 0.0–0.1)
Basophils Relative: 0.5 % (ref 0.0–3.0)
Eosinophils Absolute: 0.1 10*3/uL (ref 0.0–0.7)
Eosinophils Relative: 1.2 % (ref 0.0–5.0)
HCT: 45 % (ref 39.0–52.0)
Hemoglobin: 15.4 g/dL (ref 13.0–17.0)
Lymphocytes Relative: 25.7 % (ref 12.0–46.0)
Lymphs Abs: 2.4 10*3/uL (ref 0.7–4.0)
MCHC: 34.1 g/dL (ref 30.0–36.0)
MCV: 96.2 fl (ref 78.0–100.0)
Monocytes Absolute: 0.9 10*3/uL (ref 0.1–1.0)
Monocytes Relative: 9.4 % (ref 3.0–12.0)
Neutro Abs: 5.8 10*3/uL (ref 1.4–7.7)
Neutrophils Relative %: 63.2 % (ref 43.0–77.0)
Platelets: 170 10*3/uL (ref 150.0–400.0)
RBC: 4.68 Mil/uL (ref 4.22–5.81)
RDW: 12.8 % (ref 11.5–15.5)
WBC: 9.2 10*3/uL (ref 4.0–10.5)

## 2023-06-29 LAB — LIPID PANEL
Cholesterol: 140 mg/dL (ref 0–200)
HDL: 34.7 mg/dL — ABNORMAL LOW (ref >39.00)
LDL Cholesterol: 88 mg/dL (ref 0–99)
NonHDL: 105.67
Total CHOL/HDL Ratio: 4
Triglycerides: 88 mg/dL (ref 0.0–149.0)
VLDL: 17.6 mg/dL (ref 0.0–40.0)
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
## 2023-06-29 NOTE — Patient Instructions (Addendum)
Please stop by lab before you go If you have mychart- we will send your results within 3 business days of [place] receiving them.  If you do not have mychart- we will call you about results within 5 business days of [place] receiving them.  *please also note that you will see labs on mychart as soon as they post. I will later go in and write notes on them- will say "notes from Dr. Durene Cal"   Lets work on getting exercise going again even if 1-2 days a week with all family responsibilities and work on getting weight back down  Recommended follow up: Return in about 1 year (around 06/28/2024) for physical or sooner if needed.Schedule b4 you leave.

## 2024-01-08 ENCOUNTER — Other Ambulatory Visit: Payer: Self-pay | Admitting: Family Medicine

## 2024-03-22 ENCOUNTER — Other Ambulatory Visit (HOSPITAL_COMMUNITY): Payer: Self-pay

## 2024-03-22 ENCOUNTER — Encounter: Payer: Self-pay | Admitting: Family Medicine

## 2024-03-22 MED ORDER — PERMETHRIN 5 % EX CREA
1.0000 | TOPICAL_CREAM | Freq: Once | CUTANEOUS | 1 refills | Status: DC
Start: 2024-03-22 — End: 2024-03-23
  Filled 2024-03-22: qty 60, 1d supply, fill #0

## 2024-03-23 ENCOUNTER — Other Ambulatory Visit (HOSPITAL_COMMUNITY): Payer: Self-pay

## 2024-03-23 ENCOUNTER — Telehealth: Payer: Self-pay

## 2024-03-23 ENCOUNTER — Other Ambulatory Visit: Payer: Self-pay

## 2024-03-23 MED ORDER — PERMETHRIN 5 % EX CREA: 1.0000 | TOPICAL_CREAM | Freq: Once | CUTANEOUS | 1 refills | Status: AC

## 2024-03-23 NOTE — Telephone Encounter (Signed)
 Copied from CRM [id number]. Topic: Clinical - Prescription Issue >> Mar 23, 2024  8:29 AM Dimple Francis wrote: Reason for CRM: Patient had a medication of Permethrin  sent to a pharmacy that he no longer lives near, wanting it to be changed and sent to  [facility] [place], [place] - [address] [place] [address] Phone: [phone] Fax: [phone]  After reviewing patient request Rx has already been sent to patient preferred pharmacy today. Nothing further needed at this time.

## 2024-06-29 ENCOUNTER — Encounter: Payer: Commercial Managed Care - PPO | Admitting: Family Medicine

## 2024-09-07 ENCOUNTER — Other Ambulatory Visit: Payer: Self-pay | Admitting: Family Medicine

## 2024-09-11 ENCOUNTER — Ambulatory Visit (INDEPENDENT_AMBULATORY_CARE_PROVIDER_SITE_OTHER): Admitting: Family Medicine

## 2024-09-11 ENCOUNTER — Encounter: Payer: Self-pay | Admitting: Family Medicine

## 2024-09-11 VITALS — BP 130/68 | HR 82 | Temp 98.3°F | Ht 72.0 in | Wt 206.8 lb

## 2024-09-11 DIAGNOSIS — Z0000 Encounter for general adult medical examination without abnormal findings: Secondary | ICD-10-CM

## 2024-09-11 DIAGNOSIS — E663 Overweight: Secondary | ICD-10-CM

## 2024-09-11 DIAGNOSIS — Z131 Encounter for screening for diabetes mellitus: Secondary | ICD-10-CM | POA: Diagnosis not present

## 2024-09-11 DIAGNOSIS — E291 Testicular hypofunction: Secondary | ICD-10-CM | POA: Diagnosis not present

## 2024-09-11 DIAGNOSIS — E785 Hyperlipidemia, unspecified: Secondary | ICD-10-CM

## 2024-09-11 DIAGNOSIS — Z125 Encounter for screening for malignant neoplasm of prostate: Secondary | ICD-10-CM
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
## 2024-09-11 NOTE — Patient Instructions (Addendum)
 If you transfer to [facility]- when you move back- happy to take you back as patient- can use this paper  Please stop by lab before you go If you have mychart- we will send your results within 3 business days of us  receiving them.  If you do not have mychart- we will call you about results within 5 business days of us  receiving them.  *please also note that you will see labs on mychart as soon as they post. I will later go in and write notes on them- will say notes from Dr. Katrinka   Recommended follow up: Return in about 1 year (around 09/11/2025) for physical or sooner if needed.Schedule b4 you leave.

## 2024-09-12 LAB — CBC WITH DIFFERENTIAL/PLATELET
Basophils Absolute: 0.1 K/uL (ref 0.0–0.1)
Basophils Relative: 0.8 % (ref 0.0–3.0)
Eosinophils Absolute: 0.1 K/uL (ref 0.0–0.7)
Eosinophils Relative: 1.1 % (ref 0.0–5.0)
HCT: 49.5 % (ref 39.0–52.0)
Hemoglobin: 17 g/dL (ref 13.0–17.0)
Lymphocytes Relative: 29.4 % (ref 12.0–46.0)
Lymphs Abs: 2.6 K/uL (ref 0.7–4.0)
MCHC: 34.4 g/dL (ref 30.0–36.0)
MCV: 96.4 fl (ref 78.0–100.0)
Monocytes Absolute: 0.6 K/uL (ref 0.1–1.0)
Monocytes Relative: 7.3 % (ref 3.0–12.0)
Neutro Abs: 5.4 K/uL (ref 1.4–7.7)
Neutrophils Relative %: 61.4 % (ref 43.0–77.0)
Platelets: 229 K/uL (ref 150.0–400.0)
RBC: 5.13 Mil/uL (ref 4.22–5.81)
RDW: 12.5 % (ref 11.5–15.5)
WBC: 8.8 K/uL (ref 4.0–10.5)

## 2024-09-12 LAB — LIPID PANEL
Cholesterol: 196 mg/dL (ref 0–200)
HDL: 43 mg/dL (ref >39.00)
LDL Cholesterol: 127 mg/dL — ABNORMAL HIGH (ref 0–99)
NonHDL: 152.56
Total CHOL/HDL Ratio: 5
Triglycerides: 129 mg/dL (ref 0.0–149.0)
VLDL: 25.8 mg/dL (ref 0.0–40.0)

## 2024-09-12 LAB — COMPREHENSIVE METABOLIC PANEL WITH GFR
ALT: 35 U/L (ref 0–53)
AST: 22 U/L (ref 0–37)
Albumin: 4.8 g/dL (ref 3.5–5.2)
Alkaline Phosphatase: 83 U/L (ref 39–117)
BUN: 10 mg/dL (ref 6–23)
CO2: 29 meq/L (ref 19–32)
Calcium: 9.4 mg/dL (ref 8.4–10.5)
Chloride: 103 meq/L (ref 96–112)
Creatinine, Ser: 1.04 mg/dL (ref 0.40–1.50)
GFR: 86.7 mL/min (ref >60.00)
Glucose, Bld: 89 mg/dL (ref 70–99)
Potassium: 3.9 meq/L (ref 3.5–5.1)
Sodium: 140 meq/L (ref 135–145)
Total Bilirubin: 0.8 mg/dL (ref 0.2–1.2)
Total Protein: 7.1 g/dL (ref 6.0–8.3)

## 2024-09-12 LAB — URINALYSIS, ROUTINE W REFLEX MICROSCOPIC
Bilirubin Urine: NEGATIVE
Ketones, ur: NEGATIVE
Leukocytes,Ua: NEGATIVE
Nitrite: NEGATIVE
RBC / HPF: NONE SEEN (ref 0–?)
Specific Gravity, Urine: <=1.005 — AB (ref 1.000–1.030)
Total Protein, Urine: NEGATIVE
Urine Glucose: NEGATIVE
Urobilinogen, UA: 0.2 (ref 0.0–1.0)
WBC, UA: NONE SEEN (ref 0–?)
pH: 6 (ref 5.0–8.0)

## 2024-09-12 LAB — PSA: PSA: 0.77 ng/mL (ref 0.10–4.00)

## 2024-09-12 LAB — TESTOSTERONE: Testosterone: 170.18 ng/dL — ABNORMAL LOW (ref 300.00–890.00)

## 2024-09-12 LAB — HEMOGLOBIN A1C: Hgb A1c MFr Bld: 4.5 % — ABNORMAL LOW (ref 4.6–6.5)

## 2024-09-13 ENCOUNTER — Ambulatory Visit: Payer: Self-pay | Admitting: Family Medicine

## 2024-11-13 ENCOUNTER — Other Ambulatory Visit: Payer: Self-pay | Admitting: Family Medicine

## 2025-09-17 ENCOUNTER — Encounter: Admitting: Family Medicine
